# Patient Record
Sex: Male | Born: 1970 | Race: Black or African American | Hispanic: No | Marital: Married | Smoking: Never smoker
Health system: Southern US, Community
[De-identification: ages and names within clinical notes are randomized; demographics above are authoritative.]

## PROBLEM LIST (undated history)

## (undated) DIAGNOSIS — A159 Respiratory tuberculosis unspecified: Secondary | ICD-10-CM

## (undated) DIAGNOSIS — E119 Type 2 diabetes mellitus without complications: Secondary | ICD-10-CM

## (undated) DIAGNOSIS — G4730 Sleep apnea, unspecified: Secondary | ICD-10-CM

## (undated) DIAGNOSIS — E785 Hyperlipidemia, unspecified: Secondary | ICD-10-CM

## (undated) DIAGNOSIS — I10 Essential (primary) hypertension: Secondary | ICD-10-CM

## (undated) DIAGNOSIS — E669 Obesity, unspecified: Secondary | ICD-10-CM

## (undated) DIAGNOSIS — R011 Cardiac murmur, unspecified: Secondary | ICD-10-CM

## (undated) HISTORY — PX: TONSILLECTOMY: SUR1361

## (undated) HISTORY — DX: Type 2 diabetes mellitus without complications: E11.9

## (undated) HISTORY — DX: Essential (primary) hypertension: I10

## (undated) HISTORY — DX: Hyperlipidemia, unspecified: E78.5

## (undated) HISTORY — DX: Obesity, unspecified: E66.9

## (undated) HISTORY — DX: Cardiac murmur, unspecified: R01.1

## (undated) HISTORY — DX: Sleep apnea, unspecified: G47.30

## (undated) HISTORY — DX: Respiratory tuberculosis unspecified: A15.9

## (undated) HISTORY — PX: PALATOPLASTY: SHX2153

---

## 2006-11-18 ENCOUNTER — Encounter: Payer: Self-pay | Admitting: Family Medicine

## 2008-07-26 ENCOUNTER — Ambulatory Visit: Payer: Self-pay | Admitting: Family Medicine

## 2008-07-26 DIAGNOSIS — G4730 Sleep apnea, unspecified: Secondary | ICD-10-CM | POA: Insufficient documentation

## 2008-07-26 DIAGNOSIS — F528 Other sexual dysfunction not due to a substance or known physiological condition: Secondary | ICD-10-CM

## 2008-07-26 DIAGNOSIS — E669 Obesity, unspecified: Secondary | ICD-10-CM

## 2008-07-26 DIAGNOSIS — E785 Hyperlipidemia, unspecified: Secondary | ICD-10-CM | POA: Insufficient documentation

## 2008-07-29 LAB — CONVERTED CEMR LAB
CO2: 31 meq/L (ref 19–32)
Creatinine, Ser: 1.5 mg/dL (ref 0.4–1.5)
Glucose, Bld: 101 mg/dL — ABNORMAL HIGH (ref 70–99)

## 2008-08-06 ENCOUNTER — Ambulatory Visit: Payer: Self-pay | Admitting: Family Medicine

## 2008-08-15 ENCOUNTER — Ambulatory Visit: Payer: Self-pay | Admitting: Pulmonary Disease

## 2008-09-13 ENCOUNTER — Ambulatory Visit: Payer: Self-pay | Admitting: Family Medicine

## 2009-01-27 ENCOUNTER — Ambulatory Visit: Payer: Self-pay | Admitting: Family Medicine

## 2009-01-27 ENCOUNTER — Telehealth: Payer: Self-pay | Admitting: Family Medicine

## 2009-01-27 DIAGNOSIS — R809 Proteinuria, unspecified: Secondary | ICD-10-CM

## 2009-01-27 LAB — CONVERTED CEMR LAB
Albumin: 4.6 g/dL (ref 3.5–5.2)
Alkaline Phosphatase: 44 units/L (ref 39–117)
BUN: 15 mg/dL (ref 6–23)
Basophils Absolute: 0 10*3/uL (ref 0.0–0.1)
Calcium: 9.7 mg/dL (ref 8.4–10.5)
Creatinine, Ser: 1.45 mg/dL (ref 0.40–1.50)
Glucose, Bld: 102 mg/dL — ABNORMAL HIGH (ref 70–99)
Glucose, Urine, Semiquant: NEGATIVE
Lymphs Abs: 4.1 10*3/uL — ABNORMAL HIGH (ref 0.7–4.0)
MCHC: 33.9 g/dL (ref 30.0–36.0)
Microalb, Ur: 28.83 mg/dL — ABNORMAL HIGH (ref 0.00–1.89)
Monocytes Relative: 7 % (ref 3–12)
Neutrophils Relative %: 61 % (ref 43–77)
Nitrite: NEGATIVE
Protein, U semiquant: >=300
RDW: 15.1 % (ref 11.5–15.5)
Specific Gravity, Urine: 1.015
Total Bilirubin: 0.6 mg/dL (ref 0.3–1.2)
Urobilinogen, UA: 0.2
WBC Urine, dipstick: NEGATIVE
WBC: 13.1 10*3/uL — ABNORMAL HIGH (ref 4.0–10.5)

## 2009-02-12 ENCOUNTER — Ambulatory Visit: Payer: Self-pay | Admitting: Internal Medicine

## 2009-02-12 DIAGNOSIS — I119 Hypertensive heart disease without heart failure: Secondary | ICD-10-CM | POA: Insufficient documentation

## 2009-02-21 ENCOUNTER — Ambulatory Visit: Payer: Self-pay | Admitting: Family Medicine

## 2009-02-28 ENCOUNTER — Ambulatory Visit: Payer: Self-pay | Admitting: Internal Medicine

## 2009-03-03 LAB — CONVERTED CEMR LAB: BUN: 22 mg/dL (ref 6–23)

## 2009-03-12 ENCOUNTER — Ambulatory Visit: Payer: Self-pay | Admitting: Family Medicine

## 2009-04-10 ENCOUNTER — Ambulatory Visit: Payer: Self-pay | Admitting: Internal Medicine

## 2009-04-16 ENCOUNTER — Ambulatory Visit: Payer: Self-pay | Admitting: Family Medicine

## 2009-05-13 ENCOUNTER — Encounter: Payer: Self-pay | Admitting: Internal Medicine

## 2009-05-13 ENCOUNTER — Ambulatory Visit: Payer: Self-pay

## 2009-06-20 ENCOUNTER — Ambulatory Visit (HOSPITAL_COMMUNITY): Admission: RE | Admit: 2009-06-20 | Discharge: 2009-06-20 | Payer: Self-pay | Admitting: Specialist

## 2009-06-24 ENCOUNTER — Ambulatory Visit: Payer: Self-pay | Admitting: Family Medicine

## 2009-06-24 ENCOUNTER — Encounter (INDEPENDENT_AMBULATORY_CARE_PROVIDER_SITE_OTHER): Payer: Self-pay | Admitting: *Deleted

## 2009-07-15 ENCOUNTER — Ambulatory Visit: Payer: Self-pay

## 2009-07-15 ENCOUNTER — Encounter: Payer: Self-pay | Admitting: Internal Medicine

## 2009-11-10 ENCOUNTER — Ambulatory Visit: Payer: Self-pay | Admitting: Family Medicine

## 2010-04-07 ENCOUNTER — Ambulatory Visit: Payer: Self-pay | Admitting: Internal Medicine

## 2010-04-16 ENCOUNTER — Ambulatory Visit
Admission: RE | Admit: 2010-04-16 | Discharge: 2010-04-16 | Payer: Self-pay | Source: Home / Self Care | Attending: Family Medicine | Admitting: Family Medicine

## 2010-04-16 ENCOUNTER — Encounter (INDEPENDENT_AMBULATORY_CARE_PROVIDER_SITE_OTHER): Payer: Self-pay | Admitting: *Deleted

## 2010-05-19 NOTE — Assessment & Plan Note (Signed)
Summary: CHECK BP,HA/CLE   Vital Signs:  Patient profile:   40 year old male Weight:      258.13 pounds Temp:     98.6 degrees F oral Pulse rate:   78 / minute Pulse rhythm:   regular BP sitting:   190 / 130  (left arm) Cuff size:   large  Vitals Entered By: Janee Morn CMA (November 10, 2009 12:09 PM) CC: B/P check/headache   History of Present Illness: 40 year old with severely uncontrolled HTN on multiple meds, history of some noncompliance in the past now with self d/c of b blockade, toprol. Was having some fatigue and depression with this. Reports compliance with other medications   Some HA recently when BP have been higher  declined to get sleep study -- wife out of town   ROS: GEN: No acute illnesses, no fevers, chills, sweats, fatigue, weight loss, or URI sx. GI: No n/v/d Pulm: No SOB, cough, wheezing Interactive and getting along well at home.  Otherwise, ROS is as per the HPI.   Allergies: No Known Drug Allergies  Past History:  Past medical, surgical, family and social histories (including risk factors) reviewed, and no changes noted (except as noted below).  Past Medical History: Reviewed history from 02/12/2009 and no changes required. Heart Murmur Hyperlipidemia Hypertension, severe Positive TB test Sleep Apnea s/p UPPP Obesity  Past Surgical History: Reviewed history from 07/26/2008 and no changes required. Tonsillectomy ? Palatoplasty for sleep apnea  Family History: Reviewed history from 02/12/2009 and no changes required. Family History High cholesterol (Parent) Family History Hypertension (mom) - alive 86 2 B with HTN No family h/o ESRD No FHX CAD  Social History: Reviewed history from 08/15/2008 and no changes required. Occupation:management, housekeeping for [employer] Never Smoked Alcohol use-no Drug use-no Regular exercise-yes Married, 4 kids, lives in [place]  Physical Exam  Additional Exam:  GEN: WDWN, NAD, Non-toxic, A & O x  3 HEENT: Atraumatic, Normocephalic. Neck supple. No masses, No LAD. Ears and Nose: No external deformity. CV: RRR, No M/G/R. No JVD. No thrill. No extra heart sounds. PULM: CTA B, no wheezes, crackles, rhonchi. No retractions. No resp. distress. No accessory muscle use. EXTR: No c/c/e NEURO: Normal gait.  PSYCH: Normally interactive. Conversant. Not depressed or anxious appearing.  Calm demeanor.     Impression & Recommendations:  Problem # 1:  HYPERTENSION, HEART UNCONTROLLED W/O ASSOC CHF (ICD-402.00) Assessment Deteriorated Concerning -- titrate up Coreg, if cannot tolerate, then could try Bystolic. This patient will need multiple medications  Emphasized again that OSA f/u with sleep study, compliance with medications and failure to do so puts him at high risk for an event including CVA and death. He declines sleep study. Agrees to f/u in 2 weeks  The following medications were removed from the medication list:    Metoprolol Succinate 200 Mg Xr24h-tab (Metoprolol succinate) .Marland Kitchen... 1 by mouth daily His updated medication list for this problem includes:    Lotensin Hct 20-12.5 Mg Tabs (Benazepril-hydrochlorothiazide) .Marland Kitchen... 2 by mouth daily    Amlodipine Besylate 10 Mg Tabs (Amlodipine besylate) .Marland Kitchen... 1 by mouth daily    Spironolactone 25 Mg Tabs (Spironolactone) .Marland Kitchen... Take one tablet by mouth daily    Minoxidil 10 Mg Tabs (Minoxidil) .Marland Kitchen... 1 in the am and 1/2 in the pm    Carvedilol 6.25 Mg Tabs (Carvedilol) .Marland Kitchen... 1 by mouth two times a day for 1 week, then 2 by mouth two times a day  Problem # 2:  SLEEP APNEA (ICD-780.57) Assessment: Deteriorated  Complete Medication List: 1)  Lotensin Hct 20-12.5 Mg Tabs (Benazepril-hydrochlorothiazide) .... 2 by mouth daily 2)  Levitra 10 Mg Tabs (Vardenafil hcl) .Marland Kitchen.. 1 tab by mouth prior to intercourse 3)  Aspirin 81 Mg Tbec (Aspirin) .... One by mouth every day 4)  Amlodipine Besylate 10 Mg Tabs (Amlodipine besylate) .Marland Kitchen.. 1 by mouth daily 5)   Spironolactone 25 Mg Tabs (Spironolactone) .... Take one tablet by mouth daily 6)  Minoxidil 10 Mg Tabs (Minoxidil) .Marland Kitchen.. 1 in the am and 1/2 in the pm 7)  Carvedilol 6.25 Mg Tabs (Carvedilol) .Marland Kitchen.. 1 by mouth two times a day for 1 week, then 2 by mouth two times a day  Patient Instructions: 1)  f/u 2 weeks Prescriptions: CARVEDILOL 6.25 MG TABS (CARVEDILOL) 1 by mouth two times a day for 1 week, then 2 by mouth two times a day  #120 x 0   Entered and Authorized by:   Hannah Beat MD   Signed by:   Hannah Beat MD on 11/10/2009   Method used:   Electronically to        [id number]       [address]       [place], [place]  [address]       Ph: [phone]       Fax: [phone]   RxID:   [id number]   Current Allergies (reviewed today): No known allergies

## 2010-05-19 NOTE — Assessment & Plan Note (Signed)
Summary: LEFT KNEE SWOLLEN/DLO   Vital Signs:  Patient profile:   40 year old male Height:      67 inches Weight:      260.2 pounds BMI:     40.90 Temp:     98.5 degrees F oral Pulse rate:   80 / minute Pulse rhythm:   regular BP sitting:   170 / 120  (left arm) Cuff size:   large  Vitals Entered By: Benny Lennert CMA Duncan Dull) (June 24, 2009 11:37 AM) Pain Assessment Patient in pain? yes     Location: knee Intensity: 6   History of Present Illness: Chief complaint L knee swollen Been off of knee for 2 days   patient continues to have left knee pain, status post interarticular corticosteroid injection times one, he had some relief of symptoms for about one week. Continues to have swelling now, and he actually had an MRI of his left knee 2 days ago and [facility] orthopedics, and these films are not available for my review.  He has an upcoming appointment on Thursday with Dr. Jillyn Hidden   REVIEW OF SYSTEMS  GEN: No systemic complaints, no fevers, chills, sweats, or other acute illnesses MSK: Detailed in the HPI GI: tolerating PO intake without difficulty Neuro: No numbness, parasthesias, or tingling associated. Otherwise the pertinent positives of the ROS are noted above.    Allergies (verified): No Known Drug Allergies  Physical Exam  Msk:  L KNEE Gait: Normal heel toe pattern ROM: WNL Effusion: posterior effusion / popliteal cyst felt Echymosis or edema: none Patellar tendon NT Painful PLICA: neg Patellar grind: negative Medial and lateral patellar facet loading: negative medial and lateral joint lines: minimal T medially Mcmurray's pos Flexion-pinch pos Pos bounce home test Varus and valgus stress: stable Lachman: neg Ant and Post drawer: neg Hip abduction, IR, ER: WNL Hip flexion str: 5/5 Hip abd: 5/5 Quad: 5/5 VMO atrophy:No Hamstring concentric and eccentric: 5/5    Impression & Recommendations:  Problem # 1:  INTERNAL DERANGEMENT, LEFT KNEE  (ICD-717.9) internal derangement is suspected, I think orthopedics followup at this point is most appropriate, and I'm and given pain medications for pain control  Encouraged to keep appointment with Dr. Jillyn Hidden on Thursday, love to have the ability to review his MRI myself  Complete Medication List: 1)  Lotensin Hct 20-12.5 Mg Tabs (Benazepril-hydrochlorothiazide) .... 2 by mouth daily 2)  Metoprolol Succinate 200 Mg Xr24h-tab (Metoprolol succinate) .Marland Kitchen.. 1 by mouth daily 3)  Levitra 10 Mg Tabs (Vardenafil hcl) .Marland Kitchen.. 1 tab by mouth prior to intercourse 4)  Aspirin 81 Mg Tbec (Aspirin) .... One by mouth every day 5)  Amlodipine Besylate 10 Mg Tabs (Amlodipine besylate) .Marland Kitchen.. 1 by mouth daily 6)  Spironolactone 25 Mg Tabs (Spironolactone) .... Take one tablet by mouth daily 7)  Minoxidil 10 Mg Tabs (Minoxidil) .Marland Kitchen.. 1 in the am and 1/2 in the pm 8)  Tramadol Hcl 50 Mg Tabs (Tramadol hcl) .Marland Kitchen.. 1 by mouth 4 times daily as needed pain 9)  Hydrocodone-acetaminophen 5-500 Mg Tabs (Hydrocodone-acetaminophen) .Marland Kitchen.. 1 by mouth q 6 hours as needed for pain Prescriptions: HYDROCODONE-ACETAMINOPHEN 5-500 MG TABS (HYDROCODONE-ACETAMINOPHEN) 1 by mouth q 6 hours as needed for pain  #20 x 0   Entered and Authorized by:   Hannah Beat MD   Signed by:   Hannah Beat MD on 06/24/2009   Method used:   Print then Give to Patient   RxID:   [id number] TRAMADOL HCL 50 MG  TABS (TRAMADOL HCL) 1 by mouth 4 times daily as needed pain  #40 x 0   Entered and Authorized by:   Hannah Beat MD   Signed by:   Hannah Beat MD on 06/24/2009   Method used:   Print then Give to Patient   RxID:   [id number]   Current Allergies (reviewed today): No known allergies

## 2010-05-19 NOTE — Letter (Signed)
Summary: Out of Work  [facility]  [address] [place], [place] [address]   Phone: [phone]  Fax: [phone]    June 24, 2009   Employee:  Todd Anderson, [place]    To Whom It May Concern:   For Medical reasons, please excuse the above named employee from work for the following dates:  Start:   March 7th 2011  End:   May return Wednesday March 9th 2011  If you need additional information, please feel free to contact our office.         Sincerely,    Hannah Beat MD

## 2010-05-21 ENCOUNTER — Ambulatory Visit (INDEPENDENT_AMBULATORY_CARE_PROVIDER_SITE_OTHER): Payer: BC Managed Care – PPO | Admitting: Family Medicine

## 2010-05-21 ENCOUNTER — Encounter: Payer: Self-pay | Admitting: Family Medicine

## 2010-05-21 ENCOUNTER — Other Ambulatory Visit: Payer: Self-pay | Admitting: Family Medicine

## 2010-05-21 DIAGNOSIS — R809 Proteinuria, unspecified: Secondary | ICD-10-CM

## 2010-05-21 DIAGNOSIS — I119 Hypertensive heart disease without heart failure: Secondary | ICD-10-CM

## 2010-05-21 LAB — RENAL FUNCTION PANEL
Albumin: 3.9 g/dL (ref 3.5–5.2)
BUN: 17 mg/dL (ref 6–23)
CO2: 27 mEq/L (ref 19–32)
Calcium: 9.3 mg/dL (ref 8.4–10.5)
Creatinine, Ser: 1.7 mg/dL — ABNORMAL HIGH (ref 0.4–1.5)
Glucose, Bld: 91 mg/dL (ref 70–99)
Phosphorus: 3.8 mg/dL (ref 2.3–4.6)
Sodium: 134 mEq/L — ABNORMAL LOW (ref 135–145)

## 2010-05-21 NOTE — Letter (Signed)
Summary: Out of Work  [facility]  [address] [place], [place] [address]   Phone: [phone]  Fax: [phone]    April 16, 2010   Employee:  Todd Anderson, [place]    To Whom It May Concern:   For Medical reasons, please excuse the above named employee from work for the following dates:  Start:   April 08 2010  End:   April 22 2009  If you need additional information, please feel free to contact our office.         Sincerely,    Hannah Beat MD

## 2010-05-21 NOTE — Assessment & Plan Note (Signed)
Summary: cough,wheezing/alc   Vital Signs:  Patient profile:   40 year old male Height:      67 inches Weight:      258.25 pounds BMI:     40.59 O2 Sat:      98 % on Room air Temp:     98.4 degrees F oral Pulse rate:   112 / minute Pulse rhythm:   regular BP sitting:   150 / 90  (left arm) Cuff size:   large  Vitals Entered By: Benny Lennert CMA Duncan Dull) (April 16, 2010 12:08 PM)  O2 Flow:  Room air  History of Present Illness: Chief complaint coughing and wheezing  40 year old male:  2 1/2 weeks  no fever chills bad cough  five this morning  some shortness of breath was doing ok  made drive with [place], then started coughing laid in the bed the whole time   cough stronger.   HTN: unstable, much better, not sure if he is taking Coreg.   Acute Visit History:      The patient complains of cough, fever, headache, nasal discharge, sinus problems, and sore throat.  These symptoms began 3 weeks ago.  He denies abdominal pain and chest pain.        The patient notes wheezing and shortness of breath.  The cough interferes with his sleep.  The character of the cough is described as productive.  There is no history of respiratory retractions, tachypnea, cyanosis, or interference with oral intake associated with his cough.        There is no history of dysphagia, drooling, cold/URI symptoms, or recent exposure to strep associated with the sore throat.        Urine output has been normal.  He is tolerating clear liquids.        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  Past Medical History: Reviewed history from 04/07/2010 and no changes required. 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 Tonsillectomy ? Palatoplasty for sleep apnea  Family History: Reviewed history from 02/12/2009 and no changes required. Family History High cholesterol (Parent) Family History Hypertension (mom) - alive 98 2 B with HTN No family h/o ESRD No FHX CAD  Social History: Reviewed history from 08/15/2008 and no changes required. 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 CV: No chest pain or SOB GI: No noted N or V Otherwise, pertinent positives and negatives are noted in the HPI.   Physical Exam  General:  Well-developed,well-nourished,in no acute distress; alert,appropriate and cooperative throughout examination Head:  Normocephalic and atraumatic without obvious abnormalities. No apparent alopecia or balding.  no sinus tenderness Ears:  External ear exam shows no significant lesions or deformities.  Otoscopic examination reveals clear canals, tympanic membranes are intact bilaterally without bulging, retraction, inflammation or discharge. Hearing is grossly normal bilaterally. Nose:  External nasal examination shows no deformity or inflammation. Nasal mucosa are pink and moist without lesions or exudates. Mouth:  Oral mucosa and oropharynx without lesions or exudates.  Teeth in good repair. Neck:  No deformities, masses, or tenderness  noted. Lungs:  normal respiratory effort, no intercostal retractions, and no accessory muscle use.   poor air movement throughout with occ scatteres rhonchorous sounds. minimal wheezing. Heart:  Normal rate and regular rhythm. S1 and S2 normal without gallop, murmur, click, rub or other extra sounds.   Impression & Recommendations:  Problem # 1:  BRONCHITIS- ACUTE (ICD-466.0) Assessment New  The following medications were removed from the medication list:    Cheratussin Ac 100-10 Mg/42ml Syrp (Guaifenesin-codeine) .Marland Kitchen... Take one teaspoon q 6 hours as needed cough, sedation precations His updated medication list for this problem includes:    Doxycycline Hyclate 100 Mg Caps (Doxycycline hyclate) .Marland Kitchen... Take 1 tab twice a day    Ventolin Hfa 108 (90 Base) Mcg/act Aers (Albuterol sulfate) .Marland Kitchen... 2 puffs q 4 hours as needed wheezing  Problem # 2:  WHEEZING (ICD-786.07) Assessment: New  Problem # 3:  HYPERTENSION (ICD-401.9)  His updated medication list for this problem includes:    Lotensin Hct 20-12.5 Mg Tabs (Benazepril-hydrochlorothiazide) .Marland Kitchen... 2 by mouth daily    Amlodipine Besylate 10 Mg Tabs (Amlodipine besylate) .Marland Kitchen... 1 by mouth daily    Spironolactone 25 Mg Tabs (Spironolactone) .Marland Kitchen... Take one tablet by mouth daily    Minoxidil 10 Mg Tabs (Minoxidil) .Marland Kitchen... 1 in the am and 1/2 in the pm    Carvedilol 6.25 Mg Tabs (Carvedilol) .Marland Kitchen... 1 by mouth two times a day for 1 week, then 2 by mouth two times a day  Complete Medication List: 1)  Lotensin Hct 20-12.5 Mg Tabs (Benazepril-hydrochlorothiazide) .... 2 by mouth daily 2)  Levitra 20 Mg Tabs (Vardenafil hcl) .... Take one 1 hour prior to relations 3)  Aspirin 81 Mg Tbec (Aspirin) .... One by mouth every day 4)  Amlodipine Besylate 10 Mg Tabs (Amlodipine besylate) .Marland Kitchen.. 1 by mouth daily 5)  Spironolactone 25 Mg Tabs (Spironolactone) .... Take one tablet by mouth daily 6)  Minoxidil 10 Mg Tabs (Minoxidil) .Marland Kitchen.. 1 in the am and 1/2 in the  pm 7)  Carvedilol 6.25 Mg Tabs (Carvedilol) .Marland Kitchen.. 1 by mouth two times a day for 1 week, then 2 by mouth two times a day 8)  Doxycycline Hyclate 100 Mg Caps (Doxycycline hyclate) .... Take 1 tab twice a day 9)  Ventolin Hfa 108 (90 Base) Mcg/act Aers (Albuterol sulfate) .... 2 puffs q 4 hours as needed wheezing  Patient Instructions: 1)  BRONCHITIS 2)  -Viral or baterial infections of the lung. Fever, cough, chest pain, shortness of breath, phlegm production, fatigue are symptoms. 3)  Treatment: 4)  1. Take all medicines 5)  2. Antibiotics  6)  3. Cough suppressants 7)  4. Bronchodilators: an inhaler 8)  5. Expectorant like Guaifenesin (Robitussin, Mucinex) 9)  Fluids and Moisture help: drink lots of fluids 10)  Vaporizier or humidifier in room, shower steam 11)  --help loosen secretions and sooth breathing passages 12)  Elevate head slightly when trying to sleep.  13)  CALL ABOUT ALL THE BLOOD PRESSURE MEDICINE YOU ARE ON Prescriptions: VENTOLIN HFA 108 (90 BASE) MCG/ACT AERS (ALBUTEROL  SULFATE) 2 puffs q 4 hours as needed wheezing  #1 x 0   Entered and Authorized by:   Hannah Beat MD   Signed by:   Hannah Beat MD on 04/16/2010   Method used:   Electronically to        [id number]       [address]       [place], [place]  [address]       Ph: [phone]       Fax: [phone]   RxID:   [id number] DOXYCYCLINE HYCLATE 100 MG CAPS (DOXYCYCLINE HYCLATE) Take 1 tab twice a day  #20 x 0   Entered and Authorized by:   Hannah Beat MD   Signed by:   Hannah Beat MD on 04/16/2010   Method used:   Electronically to        [id number]       [address]       [place], [place]  [address]       Ph: [phone]       Fax: [phone]   RxID:   [id number]    Orders Added: 1)  Est. Patient Level IV [32[address]]    Current Allergies (reviewed today): No known allergies

## 2010-05-21 NOTE — Assessment & Plan Note (Signed)
Summary: COUGH,CONGESTION/CLE   Vital Signs:  Patient profile:   40 year old male Height:      67 inches Weight:      256 pounds BMI:     40.24 Temp:     99.0 degrees F oral Pulse rate:   84 / minute Pulse rhythm:   regular BP sitting:   150 / 110  (left arm) Cuff size:   large  Vitals Entered By: Selena Batten Dance CMA Duncan Dull) (April 07, 2010 8:35 AM) CC: Cough/Congestion x1 week   History of Present Illness: CC: cough  1 wk h/o "nasty" cough, mild HA with cough.  Started as bad cough w/ ST.  Mildly productive of mucous, clear sputum.  Slight fever saturday.  Has tried nyquil, dayquil, mucinex D, tylenol, theraflu.  Cough returns a few hours later.  + mild SOB first 3 days.  Now better.  No RN, congestion, fevers/chills, abd pain, n/v/d, rashes, myalgias, arthralgias.  No NS, no weight loss.  + sick contact at work.  No smokers at home.  No h/o asthma.   ED - requests something stronger, levitra seems to kick in about 5-6 hours after taken.  BP up, per patient this is good for him.  Current Medications (verified): 1)  Lotensin Hct 20-12.5 Mg Tabs (Benazepril-Hydrochlorothiazide) .... 2 By Mouth Daily 2)  Levitra 10 Mg Tabs (Vardenafil Hcl) .Marland Kitchen.. 1 Tab By Mouth Prior To Intercourse 3)  Aspirin 81 Mg  Tbec (Aspirin) .... One By Mouth Every Day 4)  Amlodipine Besylate 10 Mg Tabs (Amlodipine Besylate) .Marland Kitchen.. 1 By Mouth Daily 5)  Spironolactone 25 Mg Tabs (Spironolactone) .... Take One Tablet By Mouth Daily 6)  Minoxidil 10 Mg Tabs (Minoxidil) .Marland Kitchen.. 1 in The Am and 1/2 in The Pm 7)  Carvedilol 6.25 Mg Tabs (Carvedilol) .Marland Kitchen.. 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 No apparent alopecia or balding.  no sinus tenderness Eyes:  PERRLA, EOMI, no injection Ears:  TMs clear bilaterally Nose:  nasal discharge, no  mucosal pallor.  + L turbinates more swollen than right. Mouth:  MMM, pharynx pink and moist.  s/p UPPP Neck:  no carotid bruit or thyromegaly no cervical or supraclavicular lymphadenopathy  Lungs:  Normal respiratory effort, chest expands symmetrically. Lungs are clear to auscultation, no crackles or wheezes. Heart:  Normal rate and regular rhythm. S1 and S2 normal without gallop, murmur, click, rub or other extra sounds. Pulses:  2+ rad pulses Extremities:  brisk cap refill.   Impression & Recommendations:  Problem # 1:  URI (ICD-465.9) Treat with guafenesin, nasal saline, time. May use cough suppressant with codein for cough. Call if not improving per viral timeline as reviewed.   His updated medication list for this problem includes:    Aspirin 81 Mg Tbec (Aspirin) ..... One by mouth every day    Cheratussin Ac 100-10 Mg/63ml Syrp (Guaifenesin-codeine) .Marland Kitchen... Take  one teaspoon q 6 hours as needed cough, sedation precations  Problem # 2:  IMPOTENCE (ICD-302.72) trial of 20mg  to see if more effective.  if not, consider switch to viagra/cialis Discussed proper use of medications  His updated medication list for this problem includes:    Levitra 20 Mg Tabs (Vardenafil hcl) .Marland Kitchen... Take one 1 hour prior to relations  Complete Medication List: 1)  Lotensin Hct 20-12.5 Mg Tabs (Benazepril-hydrochlorothiazide) .... 2 by mouth daily 2)  Levitra 20 Mg Tabs (Vardenafil hcl) ....  Take one 1 hour prior to relations 3)  Aspirin 81 Mg Tbec (Aspirin) .... One by mouth every day 4)  Amlodipine Besylate 10 Mg Tabs (Amlodipine besylate) .Marland Kitchen.. 1 by mouth daily 5)  Spironolactone 25 Mg Tabs (Spironolactone) .... Take one tablet by mouth daily 6)  Minoxidil 10 Mg Tabs (Minoxidil) .Marland Kitchen.. 1 in the am and 1/2 in the pm 7)  Carvedilol 6.25 Mg Tabs (Carvedilol) .Marland Kitchen.. 1 by mouth two times a day for 1 week, then 2 by mouth two times a day 8)  Cheratussin Ac 100-10 Mg/24ml Syrp (Guaifenesin-codeine) .... Take one teaspoon q 6 hours as needed cough, sedation precations  Patient Instructions: 1)  Sounds like you have a viral upper respiratory infection. 2)  Antibiotics are not needed for this.  Viral infections usually take 7-10 days to resolve.  The cough can last 4 weeks to go away. 3)  Use medication as prescribed: cheratussin for night time 4)  Push fluids and plenty of water. 5)  Please return if you are not improving as expected, or if you have high fevers (>101.5) or difficulty swallowing, breathing. 6)  Call clinic with questions.  Pleasure to see you today!  Prescriptions: CHERATUSSIN AC 100-10 MG/5ML SYRP (GUAIFENESIN-CODEINE) take one teaspoon q 6 hours as needed cough, sedation precations  #100cc x 0   Entered and Authorized by:   Eustaquio Boyden  MD   Signed by:   Eustaquio Boyden  MD on 04/07/2010   Method used:   Print then Give to Patient   RxID:   [id number] LEVITRA 20 MG TABS (VARDENAFIL HCL) take one 1 hour prior to relations  #10 x 1   Entered and Authorized by:   Eustaquio Boyden  MD   Signed by:   Eustaquio Boyden  MD on 04/07/2010   Method used:   Electronically to        [id number]       [address]       [place], [place]  [address]       Ph: [phone]       Fax: [phone]   RxID:   [id number]    Orders Added: 1)  Est. Patient Level III [54650]    Current Allergies (reviewed today): No known allergies

## 2010-05-27 NOTE — Assessment & Plan Note (Signed)
Summary: CHECK BP/CLE   BCBS   Vital Signs:  Patient profile:   40 year old male Height:      67 inches Weight:      255.25 pounds BMI:     40.12 Temp:     99.1 degrees F oral Pulse rate:   107 / minute Pulse rhythm:   regular BP sitting:   200 / 130  (left arm) Cuff size:   large  Vitals Entered By: Benny Lennert CMA Duncan Dull) (May 21, 2010 9:59 AM)  History of Present Illness: 40 year old male with a history of malignant hypertension, noncompliance, severely and poorly controlled HTN presents with elevation of BP and proteinuria for work lab screen.   BP meds as listed below Completely off of Coreg intermittently using norvasc and spironalactone.  sometimes "they just make him feel bad" and maybe depressed.  On prior OV his BP has been in better controlled recently.  Script bottles all have dates from 10/2009 - he says that he refills them when he gets refills and uses the same bottle.  HA, no blurred vision. No CP, no SOB.  REVIEW OF SYSTEMS GEN: No acute illnesses, no fever, chills, sweats. CV: No chest pain or SOB GI: No noted N or V Otherwise, pertinent positives and negatives are noted in the HPI.   GEN: WDWN, NAD, Non-toxic, A & O x 3 HEENT: Atraumatic, Normocephalic. Neck supple. No masses, No LAD. Ears and Nose: No external deformity. CV: RRR, No M/G/R. No JVD. No thrill. No extra heart sounds. PULM: CTA B, no wheezes, crackles, rhonchi. No retractions. No resp. distress. No accessory muscle use. EXTR: No c/c/e NEURO: Normal gait.  PSYCH: Normally interactive. Conversant. Not depressed or anxious appearing.  Calm demeanor.    Allergies (verified): No Known Drug Allergies  Past History:  Past medical, surgical, family and social histories (including risk factors) reviewed, and no changes noted (except as noted below).  Past Medical History: Reviewed history from 04/07/2010 and no changes required. 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 Tonsillectomy ? Palatoplasty for sleep apnea  Family History: Reviewed history from 02/12/2009 and no changes required. Family History High cholesterol (Parent) Family History Hypertension (mom) - alive 66 2 B with HTN No family h/o ESRD No FHX CAD  Social History: Reviewed history from 08/15/2008 and no changes required. 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  noncompliance an issue. significant OSA, will not wear CPAP. declines further treatment. eval renal function, may need nephrology input.  f/u 1 week. discussed his high risk of death, cva, coronary artery disease. if rfp shows severe arf, will admit to hospital.  His updated medication list for this problem includes:    Lotensin Hct 20-12.5 Mg Tabs (Benazepril-hydrochlorothiazide) .Marland Kitchen... 2 by mouth daily    Amlodipine Besylate 10 Mg Tabs (Amlodipine besylate) .Marland Kitchen... 1 by mouth daily    Spironolactone 25 Mg Tabs (Spironolactone) .Marland Kitchen... Take one tablet by mouth daily    Minoxidil 10 Mg Tabs (Minoxidil) .Marland Kitchen... 1 in the am and 1 in the pm  Carvedilol 6.25 Mg Tabs (Carvedilol) .Marland Kitchen... 1 by mouth two times a day for 1 week, then 2 by mouth two times a day  Orders: Venipuncture (04540) Specimen Handling (98119) T-Urine 24 Hr. Protein [phone]) TLB-Renal Function Panel (80069-RENAL)  Problem # 2:  PROTEINURIA (ICD-791.0) Assessment: Deteriorated  Orders: Venipuncture (30865) Specimen Handling  (99000) T-Urine 24 Hr. Protein [phone]) TLB-Renal Function Panel (80069-RENAL)  Complete Medication List: 1)  Lotensin Hct 20-12.5 Mg Tabs (Benazepril-hydrochlorothiazide) .... 2 by mouth daily 2)  Levitra 20 Mg Tabs (Vardenafil hcl) .... Take one 1 hour prior to relations 3)  Aspirin 81 Mg Tbec (Aspirin) .... One by mouth every day 4)  Amlodipine Besylate 10 Mg Tabs (Amlodipine besylate) .Marland Kitchen.. 1 by mouth daily 5)  Spironolactone 25 Mg Tabs (Spironolactone) .... Take one tablet by mouth daily 6)  Minoxidil 10 Mg Tabs (Minoxidil) .Marland Kitchen.. 1 in the am and 1 in the pm 7)  Carvedilol 6.25 Mg Tabs (Carvedilol) .Marland Kitchen.. 1 by mouth two times a day for 1 week, then 2 by mouth two times a day 8)  Ventolin Hfa 108 (90 Base) Mcg/act Aers (Albuterol sulfate) .... 2 puffs q 4 hours as needed wheezing  Patient Instructions: 1)  f/u 1 week Prescriptions: MINOXIDIL 10 MG TABS (MINOXIDIL) 1 in the am and 1 in the pm  #60 x 5   Entered and Authorized by:   Hannah Beat MD   Signed by:   Hannah Beat MD on 05/21/2010   Method used:   Electronically to        [id number] (retail)       [address]       [place], [place]  [address]       Ph: [phone]       Fax: [phone]   RxID:   [id number] MINOXIDIL 10 MG TABS (MINOXIDIL) 1 in the am and 1/2 in the pm  #15 Tablet x 5   Entered and Authorized by:   Hannah Beat MD   Signed by:   Hannah Beat MD on 05/21/2010   Method used:   Electronically to        [id number] (retail)       [address]       [place], [place]  [address]       Ph: [phone]       Fax: [phone]   RxID:   [id number] SPIRONOLACTONE 25 MG TABS (SPIRONOLACTONE) Take one tablet by mouth daily  #30 Tablet x 5   Entered and Authorized by:   Hannah Beat MD   Signed by:   Hannah Beat MD on 05/21/2010   Method used:   Electronically to        [id number] (retail)       [address]        [place], [place]  [address]       Ph: [phone]       Fax: [phone]   RxID:   [id number] AMLODIPINE BESYLATE 10 MG TABS (AMLODIPINE BESYLATE) 1 by mouth daily  #30 Tablet x 6   Entered and Authorized by:   Hannah Beat MD   Signed by:   Hannah Beat MD on 05/21/2010   Method used:   Electronically to        [id number] (retail)       [address]       [place], [place]  [address]  Ph: [phone]       Fax: [phone]   RxID:   [id number] LOTENSIN HCT 20-12.5 MG TABS (BENAZEPRIL-HYDROCHLOROTHIAZIDE) 2 by mouth daily  #60 x 6   Entered and Authorized by:   Hannah Beat MD   Signed by:   Hannah Beat MD on 05/21/2010   Method used:   Electronically to        [id number] (retail)       [address]       [place], [place]  [address]       Ph: [phone]       Fax: [phone]   RxID:   [id number] CARVEDILOL 6.25 MG TABS (CARVEDILOL) 1 by mouth two times a day for 1 week, then 2 by mouth two times a day  #120 x 0   Entered and Authorized by:   Hannah Beat MD   Signed by:   Hannah Beat MD on 05/21/2010   Method used:   Electronically to        [id number] (retail)       [address]       [place], [place]  [address]       Ph: [phone]       Fax: [phone]   RxID:   [id number]    Orders Added: 1)  Venipuncture [71696] 2)  Specimen Handling [99000] 3)  T-Urine 24 Hr. Protein [id number] 4)  TLB-Renal Function Panel [80069-RENAL] 5)  Est. Patient Level IV [10258]    Current Allergies (reviewed today): No known allergies

## 2010-06-10 ENCOUNTER — Telehealth: Payer: Self-pay | Admitting: Family Medicine

## 2010-06-25 NOTE — Progress Notes (Signed)
Summary: FYI pt has not returned urine specimen  Phone Note Other Incoming   Summary of Call: FYI Pt has not returned specimen for 24 hour urine. Natasha Chavers CMA Duncan Dull)  June 10, 2010 1:44 PM   Follow-up for Phone Call        can you please call and check on the patient? his blood pressure was extremely high last OV and was supposed to f/u...  and never brought in his 24 hour urine. Follow-up by: Hannah Beat MD,  June 10, 2010 1:47 PM  Additional Follow-up for Phone Call Additional follow up Details #1::        left message on machine at home for patient to return my call. Work number has been disconnected and it's a long distance number. DeShannon Katrinka Blazing CMA Duncan Dull)  June 11, 2010 10:18 AM     Additional Follow-up for Phone Call Additional follow up Details #2::    Left message for patient to return my call.Consuello Masse CMA    Spoke with pt, he says he has been out of town and unable to collect urine.  States he will turn that in this week.  Lowella Petties CMA, AAMA  June 16, 2010 9:26 AM   Additional Follow-up for Phone Call Additional follow up Details #3:: Details for Additional Follow-up Action Taken: please make sure this patient has a follow-up appt. this is important. very high bp. Hannah Beat MD  June 16, 2010 9:38 AM   Patient will make appt when he brings in urine.Consuello Masse CMA   Additional Follow-up by: Benny Lennert CMA (AAMA),  June 16, 2010 10:08 AM

## 2010-07-23 ENCOUNTER — Other Ambulatory Visit: Payer: Self-pay | Admitting: Family Medicine

## 2010-11-10 ENCOUNTER — Encounter: Payer: Self-pay | Admitting: Internal Medicine

## 2010-11-17 ENCOUNTER — Other Ambulatory Visit: Payer: Self-pay | Admitting: Family Medicine

## 2011-01-26 IMAGING — US US EXTREM LOW VENOUS*L*
1 series · 18 of 24 positions shown · non-contrast
Comparison: none

REASON FOR EXAM: CALLREPORT [id number] left leg pain swelling eval DVT
COMMENTS:

PROCEDURE:     US  - US DOPPLER LOW EXTR LEFT  - January 27, 2009 [DATE]
RESULT:     The left femoral and popliteal veins are normally compressible.
The waveform patterns are normal and the color flow images are normal.

[Series 1: us extrem low venous*left* · 18 of 26 slices shown]
[im 1/26]
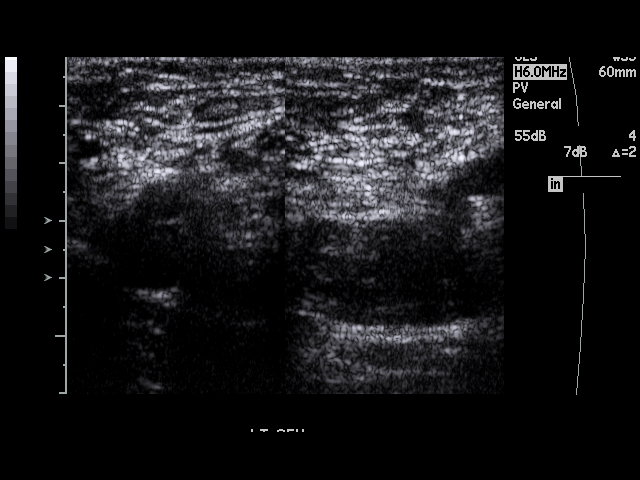
[im 3/26]
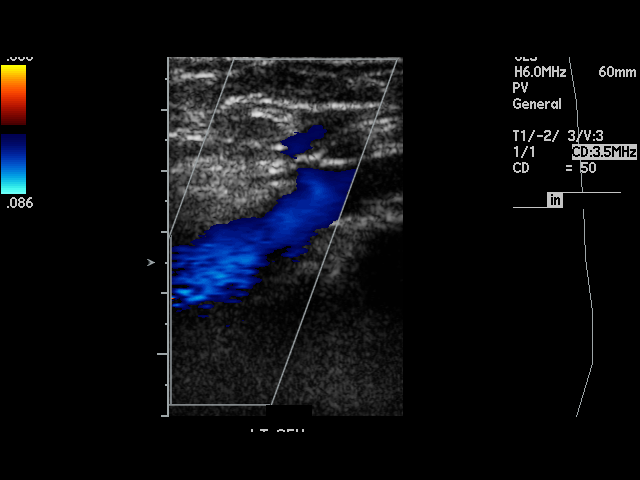
[im 4/26]
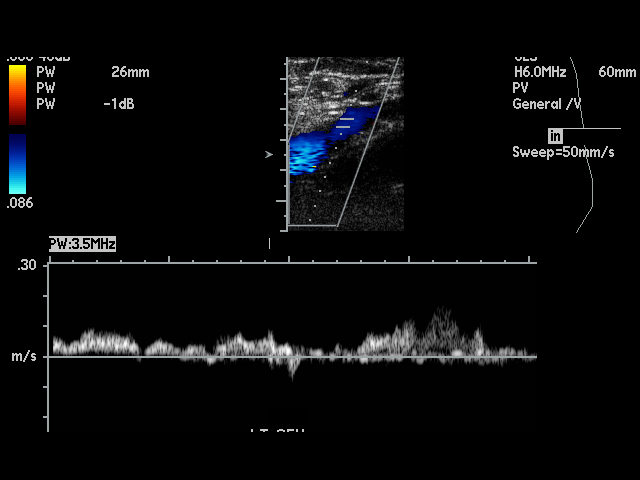
[im 5/26]
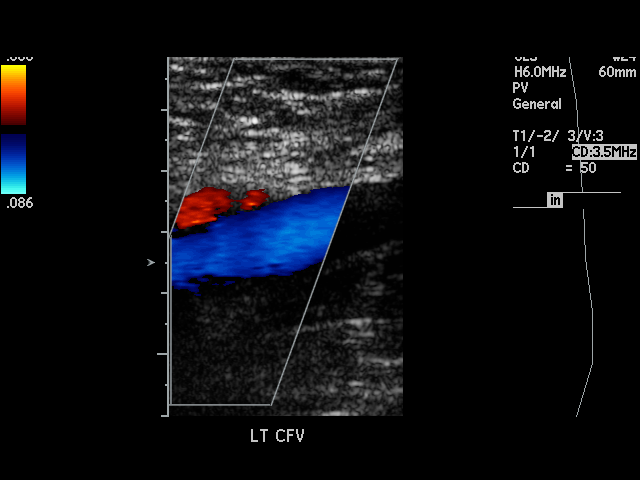
[im 7/26]
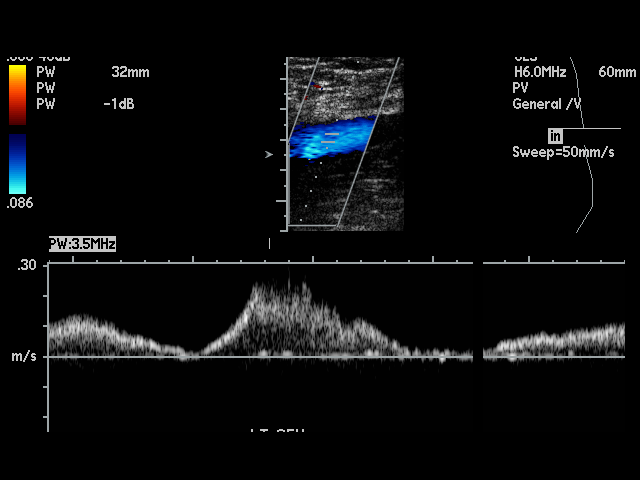
[im 8/26]
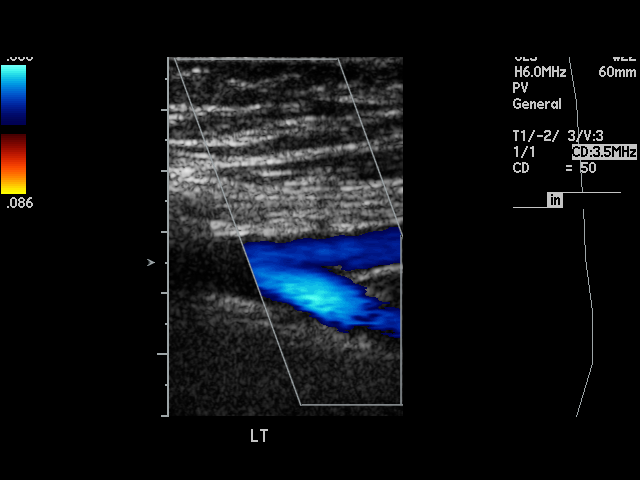
[im 9/26]
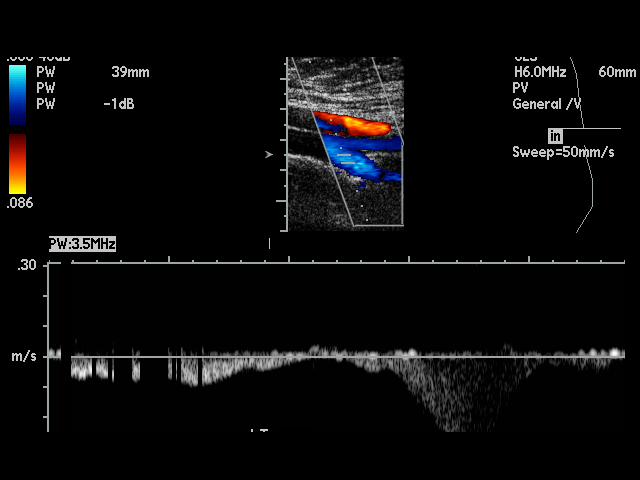
[im 11/26]
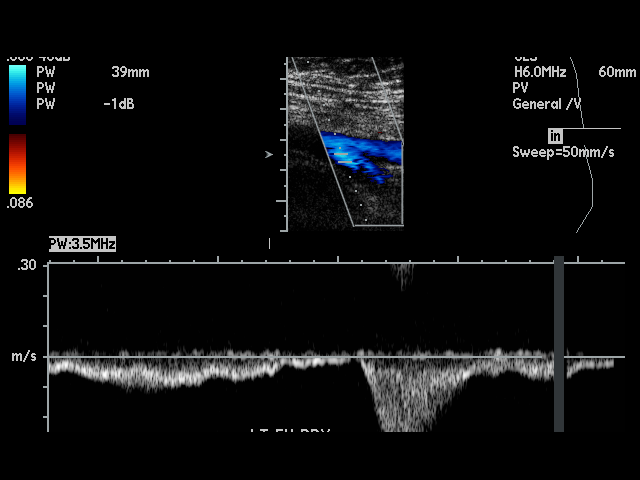
[im 12/26]
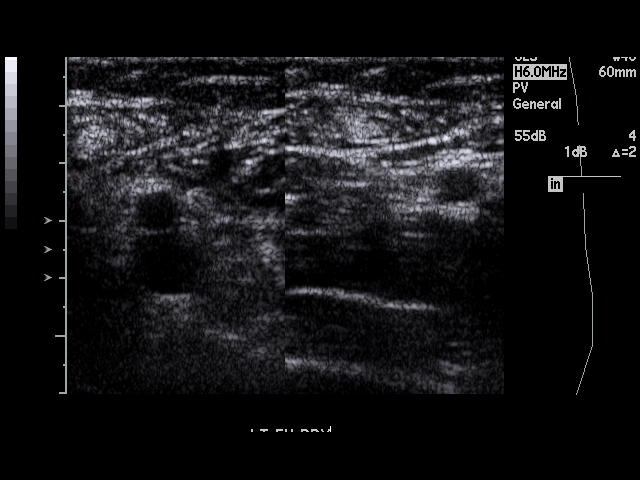
[im 14/26]
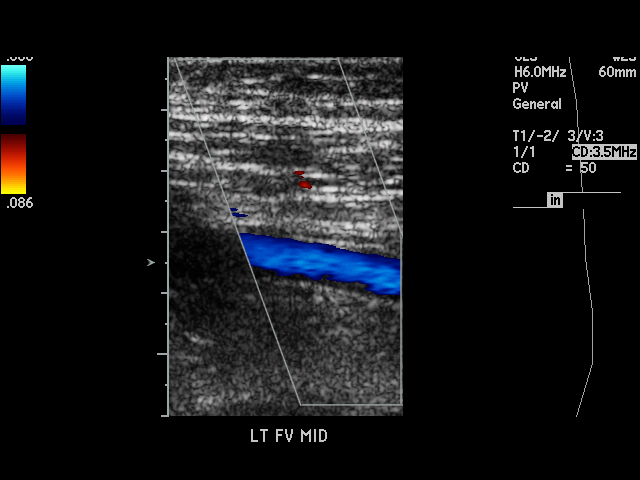
[im 16/26]
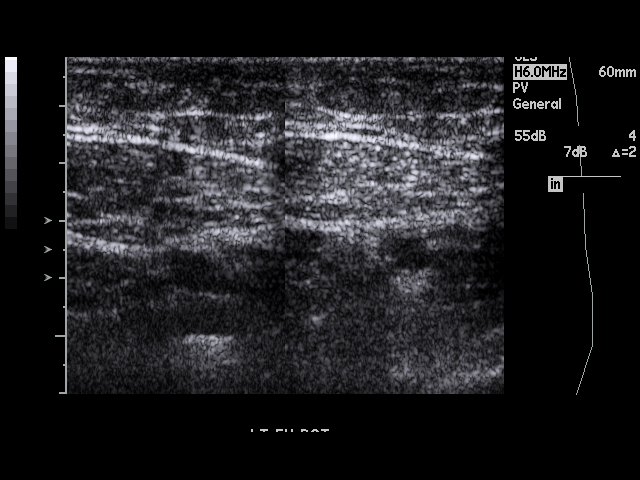
[im 17/26]
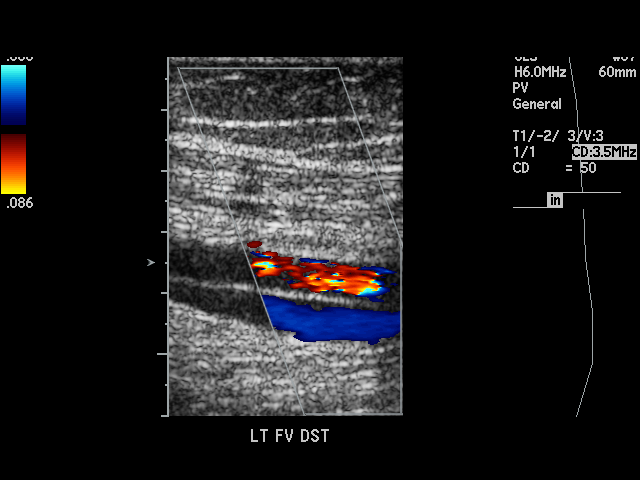
[im 18/26]
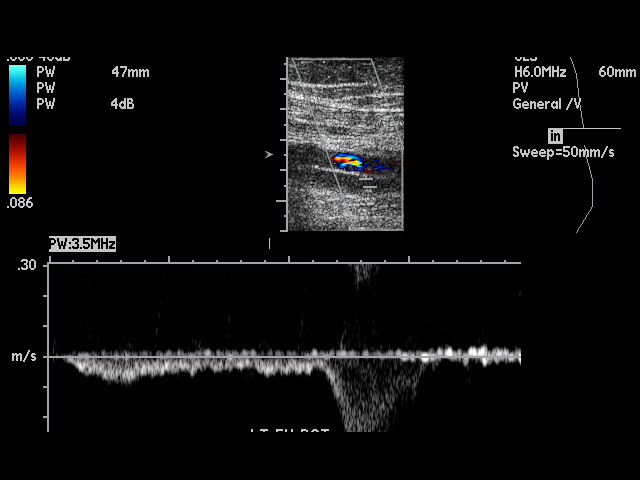
[im 20/26]
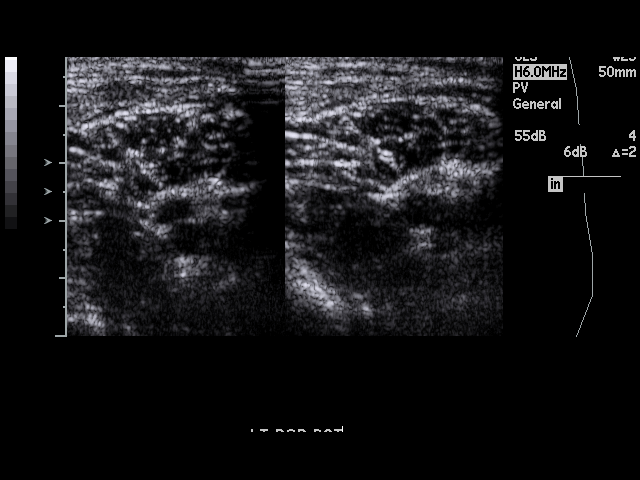
[im 21/26]
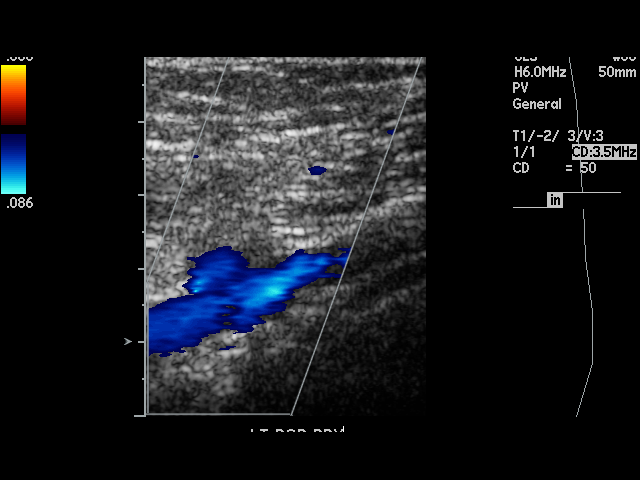
[im 22/26]
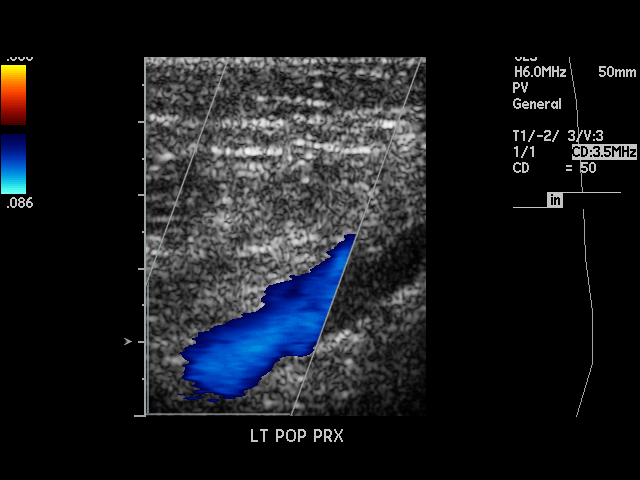
[im 24/26]
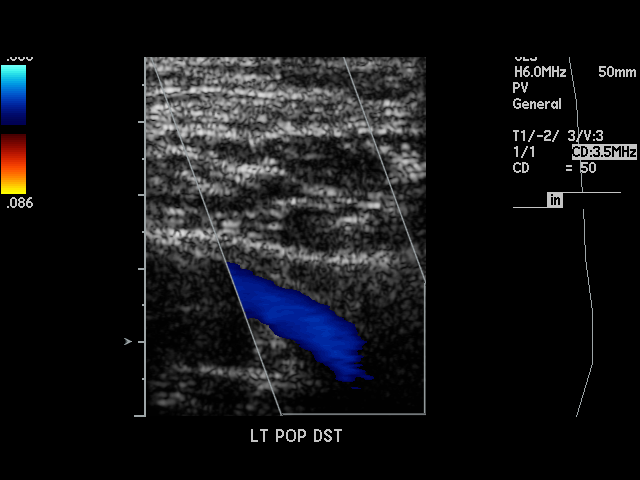
[im 26/26]
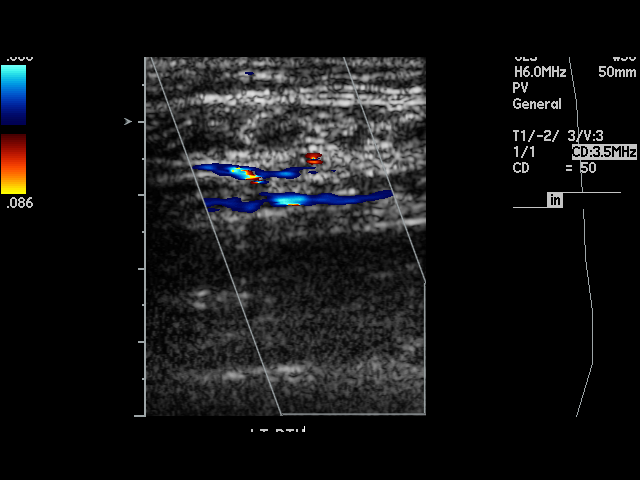

[18 of 24 positions shown; findings below may reference images not displayed]

IMPRESSION: I see no evidence of thrombus within the left femoral or
popliteal veins.

## 2013-08-14 ENCOUNTER — Ambulatory Visit: Payer: Self-pay | Admitting: Family Medicine

## 2018-08-11 ENCOUNTER — Other Ambulatory Visit: Payer: Self-pay

## 2018-08-11 ENCOUNTER — Emergency Department: Payer: No Typology Code available for payment source

## 2018-08-11 ENCOUNTER — Emergency Department
Admission: EM | Admit: 2018-08-11 | Discharge: 2018-08-11 | Disposition: A | Discharge status: Home / Self Care | Payer: No Typology Code available for payment source | Attending: Emergency Medicine | Admitting: Emergency Medicine

## 2018-08-11 ENCOUNTER — Encounter: Payer: Self-pay | Admitting: Emergency Medicine

## 2018-08-11 DIAGNOSIS — E785 Hyperlipidemia, unspecified: Secondary | ICD-10-CM | POA: Diagnosis not present

## 2018-08-11 DIAGNOSIS — E119 Type 2 diabetes mellitus without complications: Secondary | ICD-10-CM | POA: Diagnosis not present

## 2018-08-11 DIAGNOSIS — Z79899 Other long term (current) drug therapy: Secondary | ICD-10-CM | POA: Insufficient documentation

## 2018-08-11 DIAGNOSIS — R200 Anesthesia of skin: Secondary | ICD-10-CM | POA: Diagnosis present

## 2018-08-11 DIAGNOSIS — F419 Anxiety disorder, unspecified: Secondary | ICD-10-CM | POA: Diagnosis not present

## 2018-08-11 DIAGNOSIS — I10 Essential (primary) hypertension: Secondary | ICD-10-CM | POA: Insufficient documentation

## 2018-08-11 LAB — BASIC METABOLIC PANEL
Anion gap: 10 (ref 5–15)
BUN: 18 mg/dL (ref 6–20)
CO2: 24 mmol/L (ref 22–32)
Calcium: 8.7 mg/dL — ABNORMAL LOW (ref 8.9–10.3)
Chloride: 100 mmol/L (ref 98–111)
Creatinine, Ser: 1.6 mg/dL — ABNORMAL HIGH (ref 0.61–1.24)
GFR calc Af Amer: 58 mL/min — ABNORMAL LOW (ref >60)
GFR calc non Af Amer: 50 mL/min — ABNORMAL LOW (ref >60)
Glucose, Bld: 222 mg/dL — ABNORMAL HIGH (ref 70–99)
Potassium: 4 mmol/L (ref 3.5–5.1)
Sodium: 134 mmol/L — ABNORMAL LOW (ref 135–145)

## 2018-08-11 LAB — CBC WITH DIFFERENTIAL/PLATELET
Abs Immature Granulocytes: 0.04 10*3/uL (ref 0.00–0.07)
Basophils Absolute: 0 10*3/uL (ref 0.0–0.1)
Basophils Relative: 0 %
Eosinophils Absolute: 0.3 10*3/uL (ref 0.0–0.5)
Eosinophils Relative: 2 %
HCT: 40.7 % (ref 39.0–52.0)
Hemoglobin: 13.2 g/dL (ref 13.0–17.0)
Immature Granulocytes: 0 %
Lymphocytes Relative: 27 %
Lymphs Abs: 3.3 10*3/uL (ref 0.7–4.0)
MCH: 26.3 pg (ref 26.0–34.0)
MCHC: 32.4 g/dL (ref 30.0–36.0)
MCV: 81.1 fL (ref 80.0–100.0)
Monocytes Absolute: 0.8 10*3/uL (ref 0.1–1.0)
Monocytes Relative: 6 %
Neutro Abs: 8.1 10*3/uL — ABNORMAL HIGH (ref 1.7–7.7)
Neutrophils Relative %: 65 %
Platelets: 320 10*3/uL (ref 150–400)
RBC: 5.02 MIL/uL (ref 4.22–5.81)
RDW: 14.4 % (ref 11.5–15.5)
WBC: 12.6 10*3/uL — ABNORMAL HIGH (ref 4.0–10.5)
nRBC: 0 % (ref 0.0–0.2)

## 2018-08-11 LAB — TROPONIN I
Troponin I: <0.03 ng/mL (ref <0.03)
Troponin I: <0.03 ng/mL (ref <0.03)

## 2018-08-11 LAB — URINALYSIS, COMPLETE (UACMP) WITH MICROSCOPIC
Bacteria, UA: NONE SEEN
Bilirubin Urine: NEGATIVE
Glucose, UA: 50 mg/dL — AB
Hgb urine dipstick: NEGATIVE
Ketones, ur: NEGATIVE mg/dL
Leukocytes,Ua: NEGATIVE
Nitrite: NEGATIVE
Protein, ur: 30 mg/dL — AB
Specific Gravity, Urine: 1.016 (ref 1.005–1.030)
Squamous Epithelial / LPF: NONE SEEN (ref 0–5)
pH: 6 (ref 5.0–8.0)

## 2018-08-11 MED ORDER — AMLODIPINE BESYLATE 10 MG PO TABS: 10.0000 mg | ORAL_TABLET | Freq: Every day | ORAL | 1 refills | Status: AC

## 2018-08-11 MED ORDER — ISOSORBIDE MONONITRATE ER 60 MG PO TB24
60.0000 mg | ORAL_TABLET | Freq: Once | ORAL | Status: AC
  Administered 2018-08-11: 60 mg via ORAL
  Filled 2018-08-11: qty 1

## 2018-08-11 MED ORDER — METFORMIN HCL 500 MG PO TABS: 500.0000 mg | ORAL_TABLET | Freq: Two times a day (BID) | ORAL | 1 refills | Status: DC

## 2018-08-11 MED ORDER — AMLODIPINE BESYLATE 5 MG PO TABS
10.0000 mg | ORAL_TABLET | Freq: Once | ORAL | Status: AC
  Administered 2018-08-11: 14:00:00 10 mg via ORAL
  Filled 2018-08-11: qty 2

## 2018-08-11 MED ORDER — SODIUM CHLORIDE 0.9% FLUSH: 3.0000 mL | Freq: Once | INTRAVENOUS | Status: DC

## 2018-08-11 MED ORDER — ISOSORBIDE MONONITRATE ER 60 MG PO TB24: 60.0000 mg | ORAL_TABLET | Freq: Every day | ORAL | 1 refills | Status: AC

## 2018-08-11 NOTE — ED Triage Notes (Signed)
Pt to ED via POV c/o left arm tingling, pt states that he was sitting at his desk working when the tingling started. Pt states that he also felt like his heart was racing and he felt very anxious. Pt states that the tingling comes and goes. Pt states that the tingling has been happening on and off all week, anxious and nervous feeling started around 1130 today. Pt denies dizziness, slurred speech, or trouble getting his words out. No facial droop noted. Pt is in NAD.

## 2018-08-11 NOTE — ED Provider Notes (Addendum)
[facility] Emergency Department Provider Note  ____________________________________________  Time seen: Approximately 3:30 PM  I have reviewed the triage vital signs and the nursing notes.   HISTORY  Chief Complaint Numbness   HPI Todd Anderson. is a 48 y.o. male with a history of anxiety, hypertension, hyperlipidemia, obesity, and sleep apnea who presents for evaluation of tingling sensation of his left arm.  Patient works here in the hospital.  He reports that he started feeling very anxious this morning and then noticed tingling sensation on his left arm.  He decided to go to the walk-in clinic to have his blood pressure checked.  BP was found to be elevated and he was sent to the emergency room for evaluation.  The tingling sensation has now resolved.  Patient does have a history of anxiety.  He does not take any medications for it.  He denies chest pain or shortness of breath, cough or fever, no back pain or abdominal pain.  He denies personal or family history of heart attacks, blood clots, recent travel immobilization, leg pain or swelling, hemoptysis, or exogenous hormones.  Patient reports that he ran out of his indoor and amlodipine and therefore did not take it this morning.  Past Medical History:  Diagnosis Date  . Heart murmur   . HLD (hyperlipidemia)   . HTN (hypertension)    severe  . Obesity   . Sleep apnea    s/p UPPP  . 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 PROTEINURIA 01/27/2009  . HYPERLIPIDEMIA 07/26/2008  . IMPOTENCE 07/26/2008  . SLEEP APNEA 07/26/2008  . OBESITY 07/26/2008    Past Surgical History:  Procedure Laterality Date  . PALATOPLASTY     ?for sleep apnea?  . TONSILLECTOMY      Prior to Admission medications   Medication Sig Start Date End Date  Taking? Authorizing Provider  albuterol (VENTOLIN HFA) 108 (90 BASE) MCG/ACT inhaler Inhale 2 puffs into the lungs every 4 (four) hours as needed.      [provider]  amLODipine (NORVASC) 10 MG tablet Take 10 mg by mouth daily.      [provider]  aspirin 81 MG EC tablet Take 81 mg by mouth daily.      [provider]  benazepril-hydrochlorthiazide (LOTENSIN HCT) 20-12.5 MG per tablet Take 2 tablets by mouth daily.      [provider]  carvedilol (COREG) 6.25 MG tablet TAKE 1 TABLET BY MOUTH TWICE A DAY FOR 1 WEEK THEN TAKE 2 TABLETS BY MOUTH TWICE A DAY 07/23/10   Copland, Karleen Hampshire, MD  metFORMIN (GLUCOPHAGE) 500 MG tablet Take 1 tablet (500 mg total) by mouth 2 (two) times daily with a meal. 08/11/18   Don Perking, [place], MD  minoxidil (LONITEN) 10 MG tablet Take 10 mg by mouth 2 (two) times daily. Take 1 in am and 1 in pm     [provider]  spironolactone (ALDACTONE) 25 MG tablet Take 25 mg by mouth daily.      [provider]  vardenafil (LEVITRA) 20 MG tablet Take 20 mg by mouth daily as needed. Take 1 hour prior to relations     [provider]    Allergies Patient has no known allergies.  Family History  Problem Relation Age of  Onset  . Hypertension Mother        alive (one parent has high cholesterol)  . Hypertension Brother        x2  . Coronary artery disease Neg Hx        no ESRD     Social History Social History   Tobacco Use  . Smoking status: Never Smoker  Substance Use Topics  . Alcohol use: No  . Drug use: No    Review of Systems  Constitutional: Negative for fever. Eyes: Negative for visual changes. ENT: Negative for sore throat. Neck: No neck pain  Cardiovascular: Negative for chest pain. Respiratory: Negative for shortness of breath. Gastrointestinal: Negative for abdominal pain, vomiting or diarrhea. Genitourinary: Negative for dysuria. Musculoskeletal: Negative for back pain. + L arm  tingling Skin: Negative for rash. Neurological: Negative for headaches, weakness or numbness. Psych: No SI or HI. + anxiety  ____________________________________________   PHYSICAL EXAM:  VITAL SIGNS: ED Triage Vitals  Enc Vitals Group     BP 08/11/18 1207 (!) 184/97     Pulse Rate 08/11/18 1207 87     Resp 08/11/18 1207 16     Temp 08/11/18 1207 98.4 F (36.9 C)     Temp Source 08/11/18 1207 Oral     SpO2 08/11/18 1207 99 %     Weight 08/11/18 1209 260 lb (117.9 kg)     Height 08/11/18 1209 5\' 6"  (1.676 m)     Head Circumference --      Peak Flow --      Pain Score 08/11/18 1209 0     Pain Loc --      Pain Edu? --      Excl. in GC? --     Constitutional: Alert and oriented. Well appearing and in no apparent distress. HEENT:      Head: Normocephalic and atraumatic.         Eyes: Conjunctivae are normal. Sclera is non-icteric.       Mouth/Throat: Mucous membranes are moist.       Neck: Supple with no signs of meningismus. Cardiovascular: Regular rate and rhythm. No murmurs, gallops, or rubs. 2+ symmetrical distal pulses are present in all extremities. No JVD. Respiratory: Normal respiratory effort. Lungs are clear to auscultation bilaterally. No wheezes, crackles, or rhonchi.  Gastrointestinal: Soft, non tender, and non distended with positive bowel sounds. No rebound or guarding. Musculoskeletal: Nontender with normal range of motion in all extremities. No edema, cyanosis, or erythema of extremities. Neurologic: Normal speech and language. Face is symmetric. Moving all extremities. No gross focal neurologic deficits are appreciated. Skin: Skin is warm, dry and intact. No rash noted. Psychiatric: Mood and affect are normal. Speech and behavior are normal.  ____________________________________________   LABS (all labs ordered are listed, but only abnormal results are displayed)  Labs Reviewed  BASIC METABOLIC PANEL - Abnormal; Notable for the following components:       Result Value   Sodium 134 (*)    Glucose, Bld 222 (*)    Creatinine, Ser 1.60 (*)    Calcium 8.7 (*)    GFR calc non Af Amer 50 (*)    GFR calc Af Amer 58 (*)    All other components within normal limits  URINALYSIS, COMPLETE (UACMP) WITH MICROSCOPIC - Abnormal; Notable for the following components:   Color, Urine YELLOW (*)    APPearance CLEAR (*)    Glucose, UA 50 (*)    Protein, ur 30 (*)  All other components within normal limits  CBC WITH DIFFERENTIAL/PLATELET - Abnormal; Notable for the following components:   WBC 12.6 (*)    Neutro Abs 8.1 (*)    All other components within normal limits  TROPONIN I  TROPONIN I  CBG MONITORING, ED   ____________________________________________  EKG  ED ECG REPORT I, Nita Sickle, the attending physician, personally viewed and interpreted this ECG.  12:14 -normal sinus rhythm, rate of 88, normal intervals, right axis deviation, T wave inversion in inferior leads with no ST elevation.  No prior for comparison  13:42 - Normal sinus rhythm, rate of 83, normal intervals, right axis deviation, T wave inversion in lead III, no ST elevations or depressions.  Unchanged from initial ____________________________________________  RADIOLOGY  I have personally reviewed the images performed during this visit and I agree with the Radiologist's read.   Interpretation by Radiologist:  Dg Chest 2 View  Result Date: 08/11/2018 CLINICAL DATA:  Chest pain EXAM: CHEST - 2 VIEW COMPARISON:  None. FINDINGS: The heart size and mediastinal contours are within normal limits. Both lungs are clear. The visualized skeletal structures are unremarkable. IMPRESSION: No active cardiopulmonary disease. Electronically Signed   By: Elige Ko   On: 08/11/2018 13:37     ____________________________________________   PROCEDURES  Procedure(s) performed: None Procedures Critical Care performed:  None ____________________________________________    INITIAL IMPRESSION / ASSESSMENT AND PLAN / ED COURSE  48 y.o. male with a history of anxiety, hypertension, hyperlipidemia, obesity, and sleep apnea who presents for evaluation of tingling sensation of his left arm while feeling very anxious this morning. No CP, symptoms have resolved. BP elevated in the setting of non compliance with meds. Will give amlodipine and imdur as prescribed. EKG with TWI inferior leads with no prior for comparison. Repeat EKG unchanged.  Patient is low risk per heart score however will further stratify it with a troponin x2.  Labs showing glucose of 222.  Patient denies history of diabetes.  Patient also has glucose in his urine.  Will start patient on metformin.  Chest x-ray with no evidence of pneumonia or pulmonary edema.  Presentation most likely due to anxiety.    _________________________ 3:38 PM on 08/11/2018 -----------------------------------------  2nd troponin negative.  No further episodes of tingling in his arm.  Patient be discharged home with follow-up with his primary care doctor.  Discussed my standard return precautions with him.   As part of my medical decision making, I reviewed the following data within the electronic MEDICAL RECORD NUMBER Nursing notes reviewed and incorporated, Labs reviewed , EKG interpreted , Old chart reviewed, Radiograph reviewed , Notes from prior ED visits and [place] Controlled Substance Database    Pertinent labs & imaging results that were available during my care of the patient were reviewed by me and considered in my medical decision making (see chart for details).    ____________________________________________   FINAL CLINICAL IMPRESSION(S) / ED DIAGNOSES  Final diagnoses:  Anxiety  Diabetes mellitus, new onset (HCC)      NEW MEDICATIONS STARTED DURING THIS VISIT:  ED Discharge Orders         Ordered    metFORMIN (GLUCOPHAGE) 500 MG tablet  2 times daily with meals     08/11/18 1537           Note:   This document was prepared using Dragon voice recognition software and may include unintentional dictation errors.    Don Perking, [place], MD 08/11/18 1538    Don Perking,  [place], MD 08/11/18 1547

## 2018-08-11 NOTE — ED Notes (Signed)
Patient ambulatory with steady gait. Verbalized understanding of discharge instructions, prescriptions and follow-up care.

## 2018-09-12 ENCOUNTER — Encounter: Payer: No Typology Code available for payment source | Attending: Family Medicine | Admitting: *Deleted

## 2018-09-12 ENCOUNTER — Other Ambulatory Visit: Payer: Self-pay

## 2018-09-12 ENCOUNTER — Encounter: Payer: Self-pay | Admitting: *Deleted

## 2018-09-12 VITALS — BP 128/74 | Ht 66.0 in | Wt 265.7 lb

## 2018-09-12 DIAGNOSIS — E119 Type 2 diabetes mellitus without complications: Secondary | ICD-10-CM

## 2018-09-12 NOTE — Progress Notes (Signed)
Diabetes Self-Management Education  Visit Type: First/Initial  Appt. Start Time: 1410 Appt. End Time: 1525  09/12/2018  Mr. Todd Anderson, identified by name and date of birth, is a 48 y.o. male with a diagnosis of Diabetes: Type 2.   ASSESSMENT  Blood pressure 128/74, height 5\' 6"  (1.676 m), weight 265 lb 11.2 oz (120.5 kg). Body mass index is 42.89 kg/m.  Diabetes Self-Management Education - 09/12/18 1534      Visit Information   Visit Type  First/Initial      Initial Visit   Diabetes Type  Type 2    Are you currently following a meal plan?  Yes    What type of meal plan do you follow?  "limiting sodas and sugars"    Are you taking your medications as prescribed?  Yes    Date Diagnosed  2 years ago      Health Coping   How would you rate your overall health?  Fair      Psychosocial Assessment   Patient Belief/Attitude about Diabetes  Other (comment)   "concerned"   Self-care barriers  None    Self-management support  Doctor's office;Family    Patient Concerns  Nutrition/Meal planning;Medication;Glycemic Control;Weight Control    Special Needs  None    Preferred Learning Style  Auditory;Visual;Hands on    Learning Readiness  Change in progress    How often do you need to have someone help you when you read instructions, pamphlets, or other written materials from your doctor or pharmacy?  1 - Never    What is the last grade level you completed in school?  some college      Pre-Education Assessment   Patient understands the diabetes disease and treatment process.  Needs Instruction    Patient understands incorporating nutritional management into lifestyle.  Needs Instruction    Patient undertands incorporating physical activity into lifestyle.  Needs Instruction    Patient understands using medications safely.  Needs Instruction    Patient understands monitoring blood glucose, interpreting and using results  Needs Instruction    Patient understands prevention, detection,  and treatment of acute complications.  Needs Instruction    Patient understands prevention, detection, and treatment of chronic complications.  Needs Instruction    Patient understands how to develop strategies to address psychosocial issues.  Needs Instruction    Patient understands how to develop strategies to promote health/change behavior.  Needs Instruction      Complications   Last HgB A1C per patient/outside source  7.3 %   07/07/2018   How often do you check your blood sugar?  0 times/day (not testing)   BG in the office was 145 mg/dL at 1:613:10 pm - 6 1/2 hrs pp. He qualifies for free program with decrease in cost of medication and supplies due to insurance. Instructed him to call MD office to call in prescription for meter and supplies to hospital pharmacy.    Have you had a dilated eye exam in the past 12 months?  Yes    Have you had a dental exam in the past 12 months?  Yes    Are you checking your feet?  Yes    How many days per week are you checking your feet?  6      Dietary Intake   Breakfast  egs and bacon - sometimes cheese and toast; protein shake with beefs, berries, banana, almond milk or water, protein powder, yogurt, granola    Snack (morning)  tral  mix - nuts, dates, granola,;  Belvita biscuit sandwich cookies,     Lunch  non-starchy vegetables - broccoli, cauliflower, squash    Dinner  beef, chicken rarely pork, fish with potatoes, peas, beans, corn, occasional rice or pasta, lettuce, tomatoes, carrots, cabbage, greens    Beverage(s)  water, almond milk, reguar soda      Exercise   Exercise Type  Light (walking / raking leaves)    How many days per week to you exercise?  2    How many minutes per day do you exercise?  30    Total minutes per week of exercise  60      Patient Education   Previous Diabetes Education  No    Disease state   Definition of diabetes, type 1 and 2, and the diagnosis of diabetes;Factors that contribute to the development of diabetes     Nutrition management   Role of diet in the treatment of diabetes and the relationship between the three main macronutrients and blood glucose level;Reviewed blood glucose goals for pre and post meals and how to evaluate the patients' food intake on their blood glucose level.    Physical activity and exercise   Role of exercise on diabetes management, blood pressure control and cardiac health.    Medications  Reviewed patients medication for diabetes, action, purpose, timing of dose and side effects.    Monitoring  Purpose and frequency of SMBG.;Taught/discussed recording of test results and interpretation of SMBG.;Identified appropriate SMBG and/or A1C goals.    Chronic complications  Relationship between chronic complications and blood glucose control    Psychosocial adjustment  Identified and addressed patients feelings and concerns about diabetes      Individualized Goals (developed by patient)   Reducing Risk  Other (comment)      Outcomes   Expected Outcomes  Demonstrated interest in learning. Expect positive outcomes       Individualized Plan for Diabetes Self-Management Training:   Learning Objective:  Patient will have a greater understanding of diabetes self-management. Patient education plan is to attend individual and/or group sessions per assessed needs and concerns.   Plan:   Patient Instructions  Check blood sugars 1 x day before breakfast or 2 hrs after one meal every day Bring blood sugar records to the next class Call your doctor for a prescription for:  1. Meter strips (type) - hospital brand  2. Lancets (type) - hospital brand Exercise: Continue routine  for   20  minutes   3  days a week and gradually increase to 150 minutes/week Eat 3 meals day, 1-2 snacks a day Space meals 4-6 hours apart Avoid sugar sweetened drinks (soda)  Expected Outcomes:  Demonstrated interest in learning. Expect positive outcomes  Education material provided:  General Meal Planning  Guidelines Simple Meal Plan  If problems or questions, patient to contact team via:   Sharion Settler, RN, CCM, CDE [phone]  Future DSME appointment:  October 02, 2018 for Diabetes Class 1

## 2018-09-12 NOTE — Patient Instructions (Signed)
Check blood sugars 1 x day before breakfast or 2 hrs after one meal every day Bring blood sugar records to the next class  Call your doctor for a prescription for:  1. Meter strips (type) - hospital brand  2. Lancets (type) - hospital brand  Exercise: Continue routine  for   20  minutes   3  days a week and gradually increase to 150 minutes/week  Eat 3 meals day, 1-2 snacks a day Space meals 4-6 hours apart Avoid sugar sweetened drinks (soda)  Return for Todd Anderson on:

## 2018-10-02 ENCOUNTER — Encounter: Payer: Self-pay | Admitting: Dietician

## 2018-10-02 ENCOUNTER — Other Ambulatory Visit: Payer: Self-pay

## 2018-10-02 ENCOUNTER — Encounter: Payer: No Typology Code available for payment source | Attending: Family Medicine | Admitting: Dietician

## 2018-10-02 DIAGNOSIS — E119 Type 2 diabetes mellitus without complications: Secondary | ICD-10-CM

## 2018-10-02 NOTE — Progress Notes (Signed)
Appt. Start Time: 1730 Appt. End Time: 1930  Class 1 Diabetes Overview - define DM; state own type of DM; identify functions of pancreas and insulin; define insulin deficiency vs insulin resistance  Psychosocial - identify DM as a source of stress; state the effects of stress on BG control  Nutritional Management - describe effects of food on blood glucose; verbalize the importance of balance meals in controlling blood glucose  Exercise - describe the effects of exercise on blood glucose and importance of regular exercise in controlling diabetes; state a plan for personal exercise; verbalize contraindications for exercise  Self-Monitoring - state importance of SMBG; use SMBG results to effectively manage diabetes; identify importance of regular HbA1C testing and goals for results  Acute Complications - recognize hyperglycemia and hypoglycemia with causes and effects; identify blood glucose results as high, low or in control; list steps in treating and preventing high and low blood glucose  Chronic Complications/Foot, Skin, Eye Dental Care - identify possible long-term complications of diabetes (retinopathy, neuropathy, nephropathy, cardiovascular disease, infections); state importance of daily self-foot exams; describe how to examine feet and what to look for; explain appropriate eye and dental care  Lifestyle Changes/Goals & Health/Community Resources - state benefits of making appropriate lifestyle changes; identify habits that need to change (meals, tobacco, alcohol); identify strategies to reduce risk factors for personal health  Pregnancy/Sexual Health - define gestational diabetes; state importance of good blood glucose control and birth control prior to pregnancy; state importance of good blood glucose control in preventing sexual problems (impotence, vaginal dryness, infections, loss of desire); state relationship of blood glucose control and pregnancy outcome; describe risk of maternal  and fetal complications  Teaching Materials Used: Class 1 Slides/Notebook Diabetes Booklet ID Card  Medic Alert/Medic ID Forms Sleep Evaluation Exercise Handout Goals for Class 1

## 2018-10-04 ENCOUNTER — Other Ambulatory Visit: Payer: Self-pay

## 2018-10-09 ENCOUNTER — Encounter: Payer: Self-pay | Admitting: Dietician

## 2018-10-09 ENCOUNTER — Other Ambulatory Visit: Payer: Self-pay

## 2018-10-09 NOTE — Progress Notes (Signed)
Pt did not come to class 2 tonight-called pt-no answer-left message for pt to call us and reschedule class 2

## 2018-10-10 ENCOUNTER — Telehealth: Payer: Self-pay | Admitting: Dietician

## 2018-10-10 NOTE — Telephone Encounter (Signed)
Called patient to reschedule his missed class from 10/09/18. Left a voicemail message that he can come to class 3 on 10/16/18 and reschedule at that time; or to call back if he needs to reschedule both remaining classes.

## 2018-10-16 ENCOUNTER — Other Ambulatory Visit: Payer: Self-pay

## 2018-10-17 ENCOUNTER — Telehealth: Payer: Self-pay | Admitting: Dietician

## 2018-10-17 NOTE — Telephone Encounter (Signed)
Called patient to reschedule Diabetes classes 2 and 3 which he has missed. Left a voicemail message with next available evening classes on 11/27/18 and 12/04/18, and requested a call back.

## 2018-11-07 ENCOUNTER — Encounter: Payer: Self-pay | Admitting: *Deleted

## 2020-08-09 IMAGING — CR CHEST - 2 VIEW
1 series · 2 of 2 positions shown · non-contrast
Comparison: None.

CLINICAL DATA: Chest pain

EXAM:
CHEST - 2 VIEW

[Series 1: dg chest 2 view · 0.14mm/px · 2 of 2 slices shown]
[im 1/2]
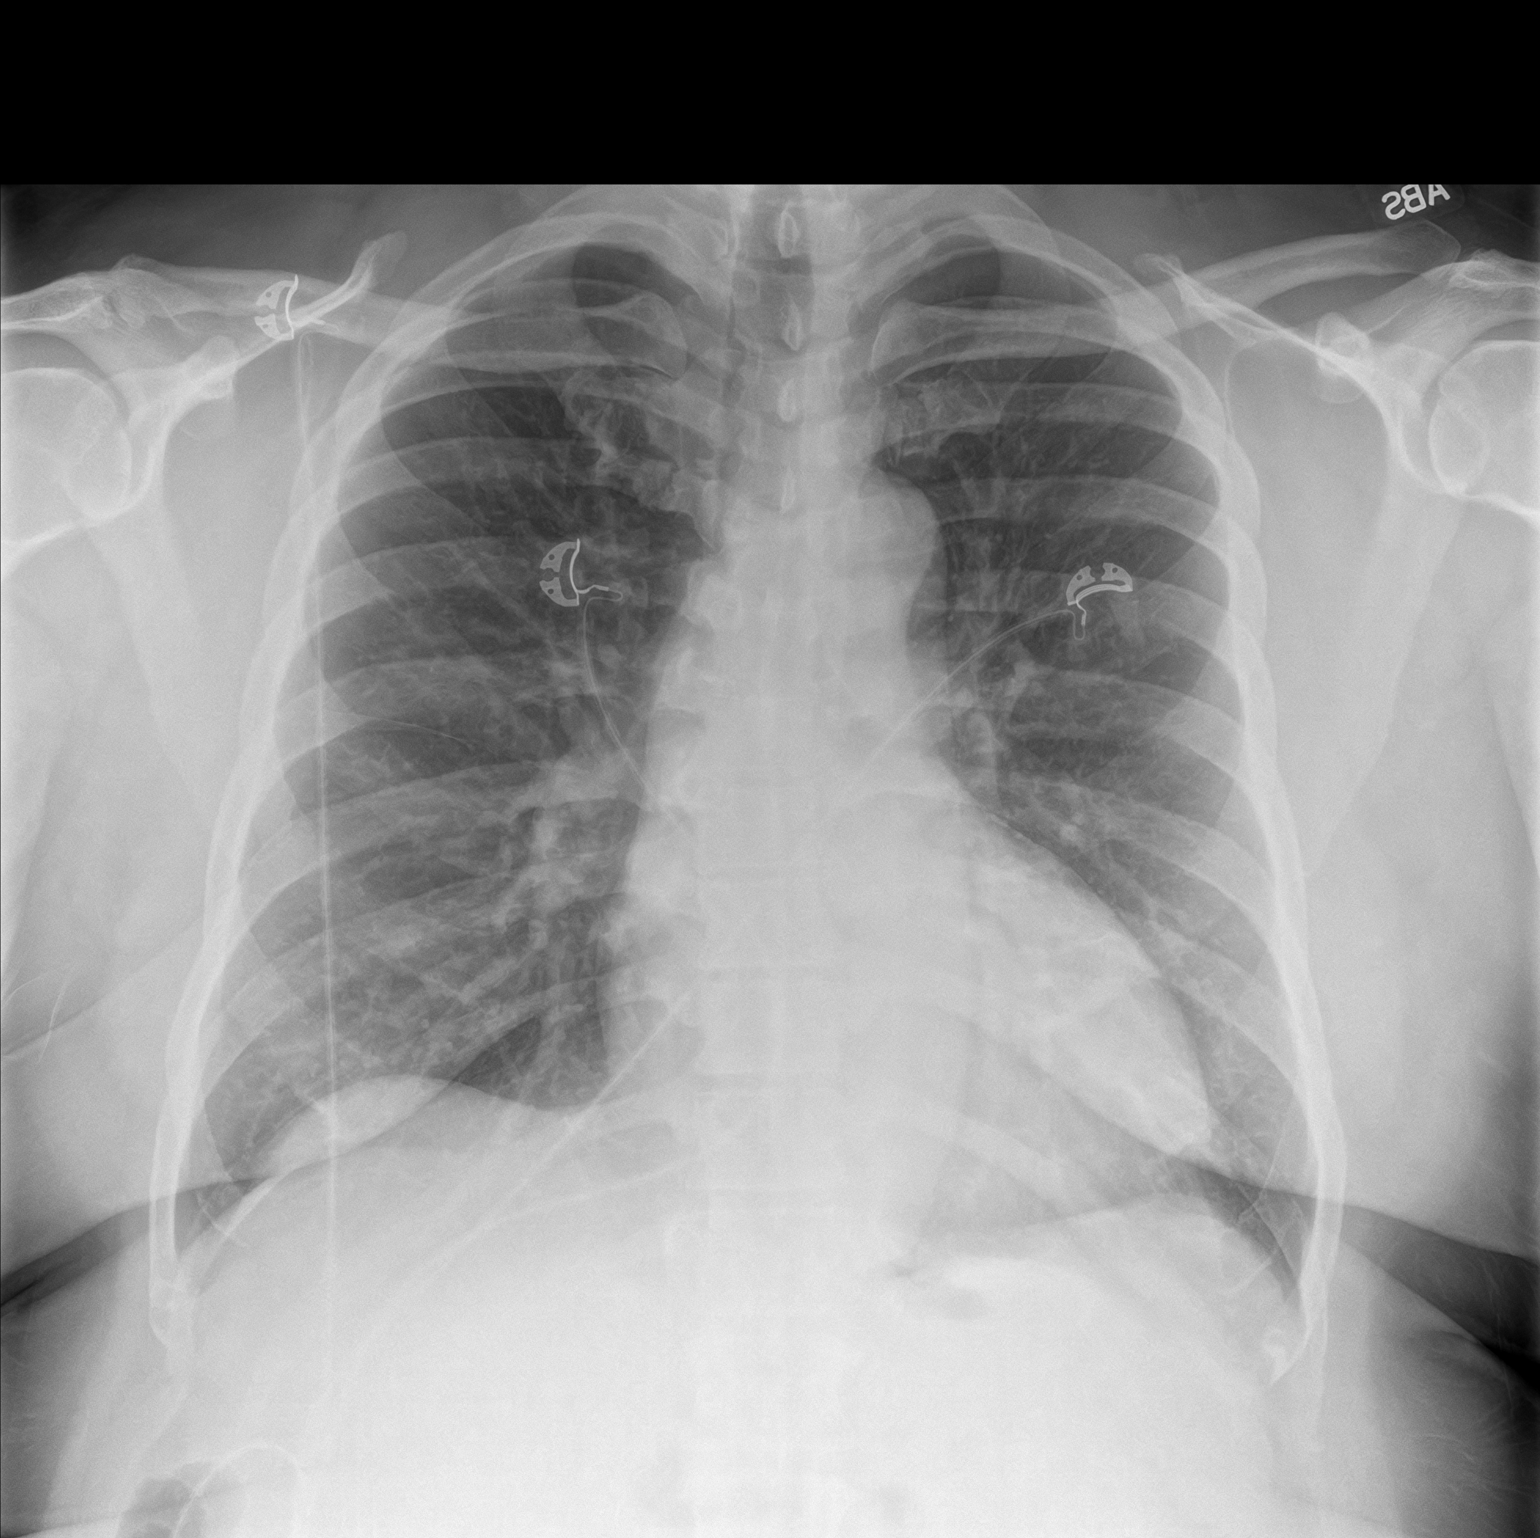
[im 2/2]
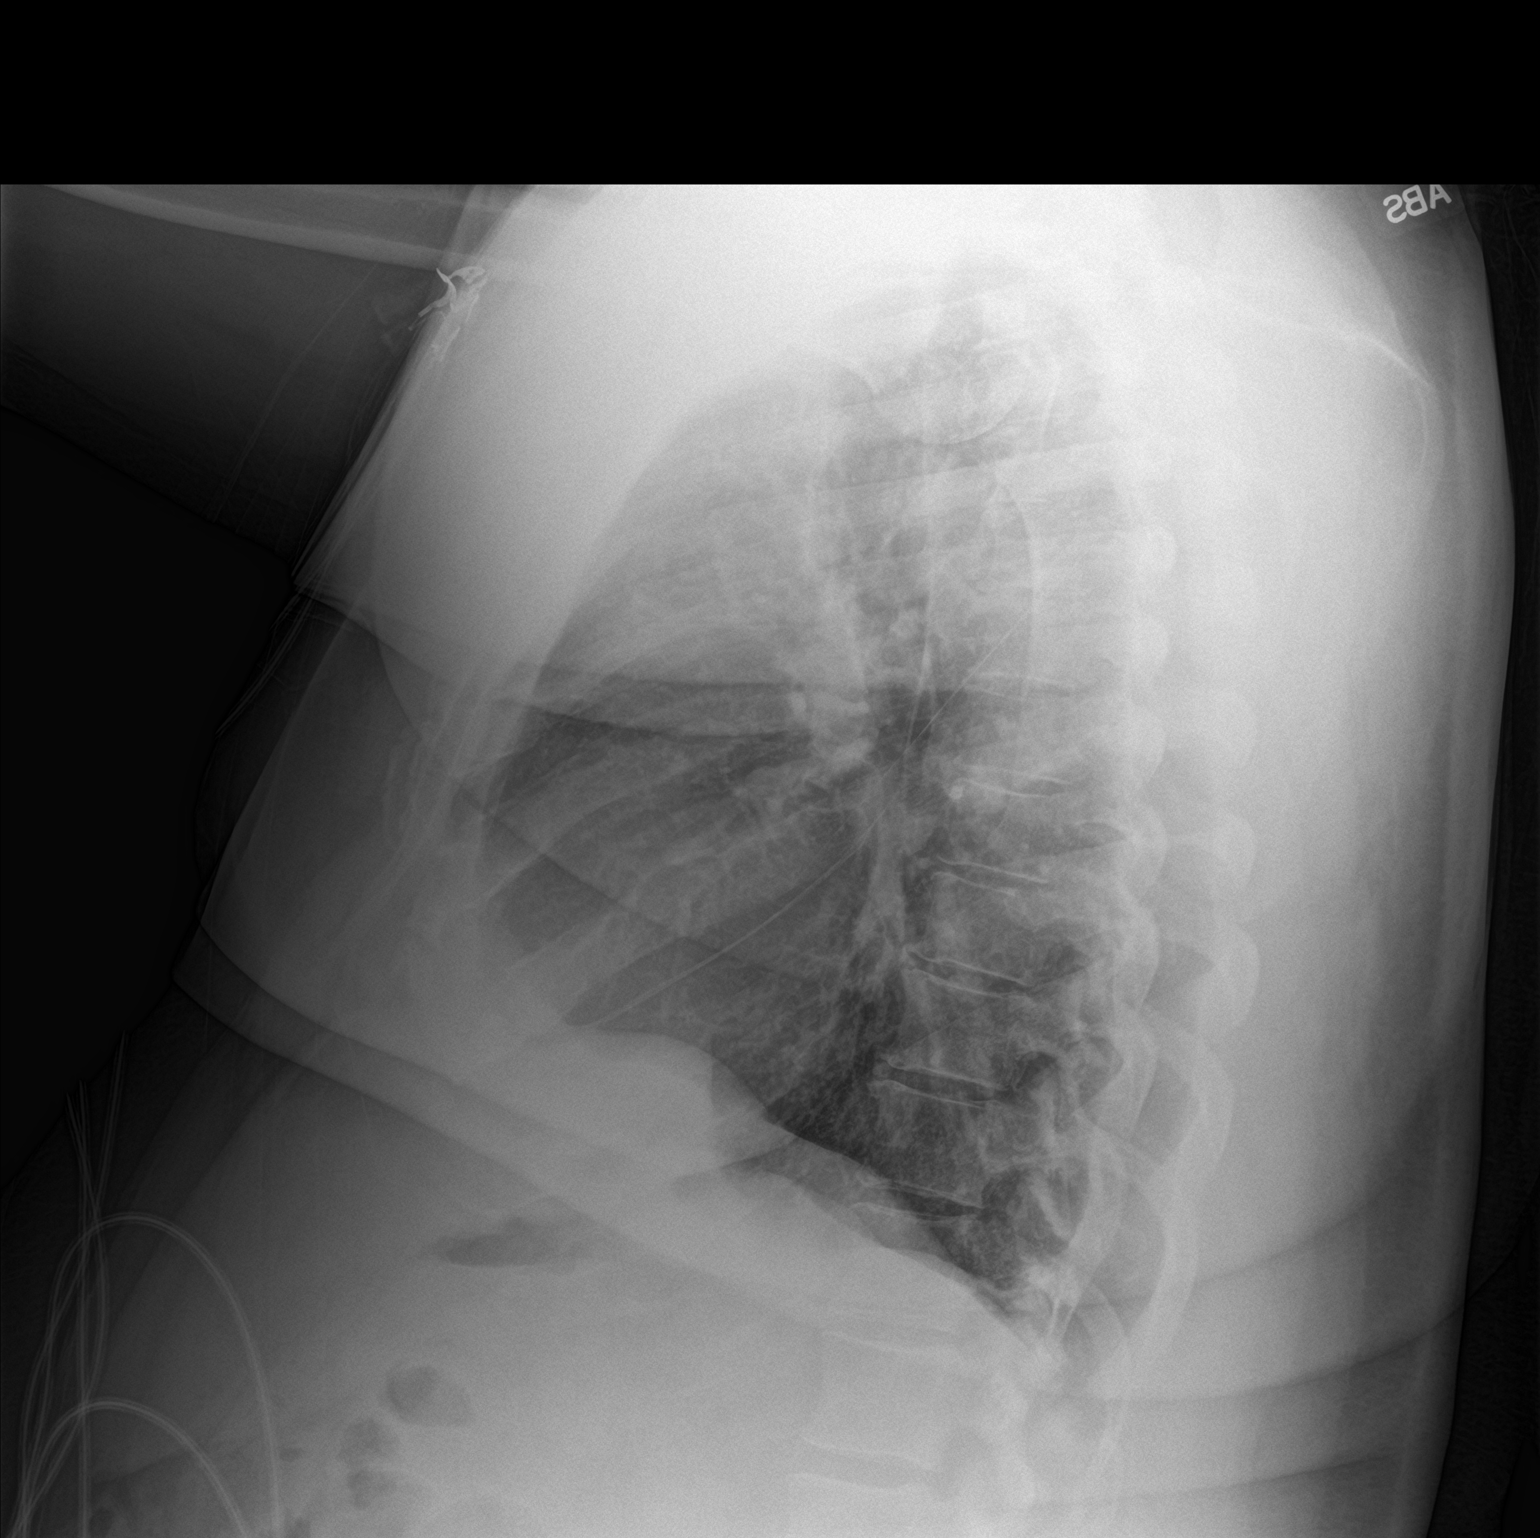

[2 of 2 positions shown; findings below may reference images not displayed]

FINDINGS: The heart size and mediastinal contours are within normal limits.
Both lungs are clear. The visualized skeletal structures are
unremarkable.
IMPRESSION: No active cardiopulmonary disease.

## 2021-08-22 DIAGNOSIS — S134XXA Sprain of ligaments of cervical spine, initial encounter: Secondary | ICD-10-CM | POA: Diagnosis not present

## 2021-10-15 ENCOUNTER — Telehealth: Payer: Self-pay | Admitting: Surgery

## 2021-10-15 DIAGNOSIS — R7612 Nonspecific reaction to cell mediated immunity measurement of gamma interferon antigen response without active tuberculosis: Secondary | ICD-10-CM

## 2021-10-15 NOTE — Telephone Encounter (Signed)
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  Left message that patient needs old TB records  of test and CXR, then we can do TB screening form if we can get them. Otherwise if he needs CXR will have to pay for it thru urgent care or PCP. Asked him to call back so we can see how to best proceed and get him what he needs.   Jennye Moccasin, MD

## 2021-10-16 ENCOUNTER — Telehealth: Payer: Self-pay | Admitting: Surgery

## 2021-10-16 DIAGNOSIS — R7612 Nonspecific reaction to cell mediated immunity measurement of gamma interferon antigen response without active tuberculosis: Secondary | ICD-10-CM

## 2021-10-16 NOTE — Telephone Encounter (Signed)
Missed incoming call from pt yesterday, got voicemail earlier today, asked him to let me know when a good time was to call regarding if we can help him with his TB documentation from the past. Also texted same info, waiting to hear back.  Will try again next week if no response, patient says he needs a chest xray for work, but has a long time known diagnosis of LTBI.  Jennye Moccasin, MD

## 2022-12-07 ENCOUNTER — Ambulatory Visit
Admission: RE | Admit: 2022-12-07 | Discharge: 2022-12-07 | Disposition: A | Discharge status: Home / Self Care | Payer: BC Managed Care – PPO | Source: Ambulatory Visit | Attending: Physician Assistant | Admitting: Physician Assistant

## 2022-12-07 ENCOUNTER — Other Ambulatory Visit: Payer: Self-pay | Admitting: Physician Assistant

## 2022-12-07 DIAGNOSIS — M79604 Pain in right leg: Secondary | ICD-10-CM | POA: Insufficient documentation

## 2023-08-10 ENCOUNTER — Other Ambulatory Visit: Payer: Self-pay | Admitting: Student

## 2023-08-10 DIAGNOSIS — M79662 Pain in left lower leg: Secondary | ICD-10-CM

## 2023-08-10 DIAGNOSIS — M1712 Unilateral primary osteoarthritis, left knee: Secondary | ICD-10-CM

## 2023-08-11 ENCOUNTER — Encounter: Payer: Self-pay | Admitting: Student

## 2024-01-17 ENCOUNTER — Other Ambulatory Visit: Payer: Self-pay | Admitting: Student

## 2024-01-17 DIAGNOSIS — M79662 Pain in left lower leg: Secondary | ICD-10-CM

## 2024-01-17 DIAGNOSIS — M25862 Other specified joint disorders, left knee: Secondary | ICD-10-CM

## 2024-01-17 DIAGNOSIS — M1712 Unilateral primary osteoarthritis, left knee: Secondary | ICD-10-CM

## 2024-01-19 ENCOUNTER — Encounter: Payer: Self-pay | Admitting: Student

## 2024-02-03 ENCOUNTER — Ambulatory Visit
Admission: RE | Admit: 2024-02-03 | Discharge: 2024-02-03 | Disposition: A | Source: Ambulatory Visit | Attending: Student

## 2024-02-03 DIAGNOSIS — M79662 Pain in left lower leg: Secondary | ICD-10-CM

## 2024-02-03 DIAGNOSIS — M25862 Other specified joint disorders, left knee: Secondary | ICD-10-CM

## 2024-02-03 DIAGNOSIS — M1712 Unilateral primary osteoarthritis, left knee: Secondary | ICD-10-CM
